# Patient Record
Sex: Female | Born: 1990 | Race: White | Hispanic: No | Marital: Single | Smoking: Former smoker
Health system: Western US, Community
[De-identification: ages and names within clinical notes are randomized; demographics above are authoritative.]

## PROBLEM LIST (undated history)

## (undated) DIAGNOSIS — W19XXXA Unspecified fall, initial encounter: Secondary | ICD-10-CM

## (undated) DIAGNOSIS — S060X9A Concussion with loss of consciousness of unspecified duration, initial encounter: Secondary | ICD-10-CM

## (undated) DIAGNOSIS — G43109 Migraine with aura, not intractable, without status migrainosus: Secondary | ICD-10-CM

## (undated) DIAGNOSIS — H9190 Unspecified hearing loss, unspecified ear: Secondary | ICD-10-CM

## (undated) HISTORY — DX: Concussion with loss of consciousness of unspecified duration, initial encounter: S06.0X9A

## (undated) HISTORY — DX: Unspecified fall, initial encounter: W19.XXXA

## (undated) HISTORY — DX: Unspecified hearing loss, unspecified ear: H91.90

## (undated) HISTORY — DX: Migraine with aura, not intractable, without status migrainosus: G43.109

---
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
## 2009-06-10 ENCOUNTER — Ambulatory Visit: Payer: Self-pay | Admitting: Occupational Medicine

## 2009-06-10 DIAGNOSIS — S0010XA Contusion of unspecified eyelid and periocular area, initial encounter: Secondary | ICD-10-CM | POA: Insufficient documentation

## 2013-05-26 ENCOUNTER — Encounter: Payer: Self-pay | Admitting: Certified Nurse Midwife

## 2013-05-27 ENCOUNTER — Encounter: Payer: Self-pay | Admitting: Gynecology

## 2013-05-27 ENCOUNTER — Other Ambulatory Visit: Payer: Self-pay | Admitting: Gynecology

## 2013-05-27 ENCOUNTER — Ambulatory Visit (INDEPENDENT_AMBULATORY_CARE_PROVIDER_SITE_OTHER): Payer: BC Managed Care – PPO | Admitting: Gynecology

## 2013-05-27 VITALS — BP 100/60 | HR 70 | Resp 12 | Ht 66.75 in | Wt 156.0 lb

## 2013-05-27 DIAGNOSIS — Z113 Encounter for screening for infections with a predominantly sexual mode of transmission: Secondary | ICD-10-CM

## 2013-05-27 DIAGNOSIS — N83209 Unspecified ovarian cyst, unspecified side: Secondary | ICD-10-CM

## 2013-05-27 DIAGNOSIS — R102 Pelvic and perineal pain: Secondary | ICD-10-CM | POA: Insufficient documentation

## 2013-05-27 DIAGNOSIS — N949 Unspecified condition associated with female genital organs and menstrual cycle: Secondary | ICD-10-CM

## 2013-05-27 LAB — RPR

## 2013-05-27 LAB — POCT URINE PREGNANCY: Preg Test, Ur: NEGATIVE

## 2013-05-27 NOTE — Patient Instructions (Signed)
Ovarian Cyst  The ovaries are small organs that are on each side of the uterus. The ovaries are the organs that produce the female hormones, estrogen and progesterone. An ovarian cyst is a sac filled with fluid that can vary in its size. It is normal for a small cyst to form in women who are in the childbearing age and who have menstrual periods. This type of cyst is called a follicle cyst that becomes an ovulation cyst (corpus luteum cyst) after it produces the women's egg. It later goes away on its own if the woman does not become pregnant. There are other kinds of ovarian cysts that may cause problems and may need to be treated. The most serious problem is a cyst with cancer. It should be noted that menopausal women who have an ovarian cyst are at a higher risk of it being a cancer cyst. They should be evaluated very quickly, thoroughly and followed closely. This is especially true in menopausal women because of the high rate of ovarian cancer in women in menopause.  CAUSES AND TYPES OF OVARIAN CYSTS:   FUNCTIONAL CYST: The follicle/corpus luteum cyst is a functional cyst that occurs every month during ovulation with the menstrual cycle. They go away with the next menstrual cycle if the woman does not get pregnant. Usually, there are no symptoms with a functional cyst.   ENDOMETRIOMA CYST: This cyst develops from the lining of the uterus tissue. This cyst gets in or on the ovary. It grows every month from the bleeding during the menstrual period. It is also called a "chocolate cyst" because it becomes filled with blood that turns brown. This cyst can cause pain in the lower abdomen during intercourse and with your menstrual period.   CYSTADENOMA CYST: This cyst develops from the cells on the outside of the ovary. They usually are not cancerous. They can get very big and cause lower abdomen pain and pain with intercourse. This type of cyst can twist on itself, cut off its blood supply and cause severe pain. It  also can easily rupture and cause a lot of pain.   DERMOID CYST: This type of cyst is sometimes found in both ovaries. They are found to have different kinds of body tissue in the cyst. The tissue includes skin, teeth, hair, and/or cartilage. They usually do not have symptoms unless they get very big. Dermoid cysts are rarely cancerous.   POLYCYSTIC OVARY: This is a rare condition with hormone problems that produces many small cysts on both ovaries. The cysts are follicle-like cysts that never produce an egg and become a corpus luteum. It can cause an increase in body weight, infertility, acne, increase in body and facial hair and lack of menstrual periods or rare menstrual periods. Many women with this problem develop type 2 diabetes. The exact cause of this problem is unknown. A polycystic ovary is rarely cancerous.   THECA LUTEIN CYST: Occurs when too much hormone (human chorionic gonadotropin) is produced and over-stimulates the ovaries to produce an egg. They are frequently seen when doctors stimulate the ovaries for invitro-fertilization (test tube babies).   LUTEOMA CYST: This cyst is seen during pregnancy. Rarely it can cause an obstruction to the birth canal during labor and delivery. They usually go away after delivery.  SYMPTOMS    Pelvic pain or pressure.   Pain during sexual intercourse.   Increasing girth (swelling) of the abdomen.   Abnormal menstrual periods.   Increasing pain with menstrual periods.     You stop having menstrual periods and you are not pregnant.  DIAGNOSIS   The diagnosis can be made during:   Routine or annual pelvic examination (common).   Ultrasound.   X-ray of the pelvis.   CT Scan.   MRI.   Blood tests.  TREATMENT    Treatment may only be to follow the cyst monthly for 2 to 3 months with your caregiver. Many go away on their own, especially functional cysts.   May be aspirated (drained) with a long needle with ultrasound, or by laparoscopy (inserting a tube into  the pelvis through a small incision).   The whole cyst can be removed by laparoscopy.   Sometimes the cyst may need to be removed through an incision in the lower abdomen.   Hormone treatment is sometimes used to help dissolve certain cysts.   Birth control pills are sometimes used to help dissolve certain cysts.  HOME CARE INSTRUCTIONS   Follow your caregiver's advice regarding:   Medicine.   Follow up visits to evaluate and treat the cyst.   You may need to come back or make an appointment with another caregiver, to find the exact cause of your cyst, if your caregiver is not a gynecologist.   Get your yearly and recommended pelvic examinations and Pap tests.   Let your caregiver know if you have had an ovarian cyst in the past.  SEEK MEDICAL CARE IF:    Your periods are late, irregular, they stop, or are painful.   Your stomach (abdomen) or pelvic pain does not go away.   Your stomach becomes larger or swollen.   You have pressure on your bladder or trouble emptying your bladder completely.   You have painful sexual intercourse.   You have feelings of fullness, pressure, or discomfort in your stomach.   You lose weight for no apparent reason.   You feel generally ill.   You become constipated.   You lose your appetite.   You develop acne.   You have an increase in body and facial hair.   You are gaining weight, without changing your exercise and eating habits.   You think you are pregnant.  SEEK IMMEDIATE MEDICAL CARE IF:    You have increasing abdominal pain.   You feel sick to your stomach (nausea) and/or vomit.   You develop a fever that comes on suddenly.   You develop abdominal pain during a bowel movement.   Your menstrual periods become heavier than usual.  Document Released: 12/09/2005 Document Revised: 03/02/2012 Document Reviewed: 10/12/2009  [employer] Patient Information 2014 [employer].

## 2013-05-27 NOTE — Progress Notes (Signed)
Subjective:     Patient ID: Yesenia Martin, female   DOB: 1991-09-01, 22 y.o.   MRN: [id number]  HPI Comments: Pt seen at [facility] in [place] for sharp pain suprapubic pain that began that evening, paain severe enough that pt could not walk upright, pt reports nausea, felt feverish, dizzy.  , As part of evaluation, pt had pelvic ultrasound which showed either a hydrosalpinx vs a paratubal cyst-images NA for this visit.  Pt had GC/CTM done and was  started on antibiotics- Ciprofloxin and Doxycycline each for 2w. , she has been sexually active, most recently 5/24, LMP 04/23/2013,  and is on birth control, denies missed pills.  Pt had negative GC/CTM here in 06/2012.  Pt's current partner since October 2013. Pt reports feeling better but exhausted.      Review of Systems  Constitutional: Positive for fatigue. Negative for fever, chills and activity change.  Gastrointestinal: Positive for nausea (once after abx) and vomiting (once after abx). Negative for abdominal pain, diarrhea and constipation.  Genitourinary: Positive for vaginal bleeding (spotting, beige not brown or red) and pelvic pain (resolving). Negative for vaginal discharge.  All other systems reviewed and are negative.       Objective:   Physical Exam  Constitutional: She is oriented to person, place, and time. She appears well-developed and well-nourished.  Abdominal: Soft. She exhibits no distension and no mass. There is no tenderness. There is no rebound and no guarding.  Neurological: She is alert and oriented to person, place, and time.  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

## 2013-05-31 LAB — HSV(HERPES SIMPLEX VRS) I + II AB-IGG: HSV 1 Glycoprotein G Ab, IgG: <0.1 IV

## 2013-06-09 ENCOUNTER — Telehealth: Payer: Self-pay | Admitting: Gynecology

## 2013-06-09 NOTE — Telephone Encounter (Signed)
Spoke with pt who finished her antibiotics 2 days ago and is feeling better. Pt inquiring about need for another [place]. Advised that since the [place] she had done did not give a definitive diagnosis, Dr. Farrel Gobble wanted to do one in our office to hopefully get a better picture of what caused the pain. Pt agreeable. Advised our insurance dept would be in touch about OOP cost, and then we would schedule it.

## 2013-06-09 NOTE — Telephone Encounter (Signed)
Patient has finished medicine and wants to speak with nurse about whether she needs an ultrasound or not.

## 2013-06-15 ENCOUNTER — Ambulatory Visit (INDEPENDENT_AMBULATORY_CARE_PROVIDER_SITE_OTHER): Payer: BC Managed Care – PPO | Admitting: Gynecology

## 2013-06-15 ENCOUNTER — Ambulatory Visit (INDEPENDENT_AMBULATORY_CARE_PROVIDER_SITE_OTHER): Payer: BC Managed Care – PPO

## 2013-06-15 DIAGNOSIS — N9489 Other specified conditions associated with female genital organs and menstrual cycle: Secondary | ICD-10-CM | POA: Insufficient documentation

## 2013-06-15 DIAGNOSIS — N83209 Unspecified ovarian cyst, unspecified side: Secondary | ICD-10-CM

## 2013-06-15 DIAGNOSIS — N839 Noninflammatory disorder of ovary, fallopian tube and broad ligament, unspecified: Secondary | ICD-10-CM

## 2013-06-15 NOTE — Progress Notes (Signed)
Pt here for f/u u/s after being seen in urgent care 6/3 for acute pelvic pain and thought to have a hydrosalpinx vs ovarian cyst on left.  Pt states that the pain that brought her to the ER has resolved and not recurred.  She denies history of STD in past. U/s images from today compared with report from urgent care:  A left adnexal mass is again , it has increased in size, now 6x4x6cm, a second thin wall cyst is noted, 2.8x2.9cm.  The is free fluid around the left adnexa and in cul de sac, it appears echofree.  The uterus is normal, there is a possible hydrosalpinx on the right. The differential was discussed, either a cystadenoma on left or bilateral hydorsalpinx, left greater than right.  We discussed operative evaluation and treatment of the pelvis, either removal of the cyst on the left or repair of the tube, both were reviewed with the patient, as she is not having pain, I referred her to acog, webmd and medscape websites to read about cystadenomas and hydrosalpinx. She agrees, she will get back to [place]  66m discussing adnexal mass and treatment

## 2013-06-15 NOTE — Patient Instructions (Signed)
Ovarian Cyst The ovaries are small organs that are on each side of the uterus. The ovaries are the organs that produce the female hormones, estrogen and progesterone. An ovarian cyst is a sac filled with fluid that can vary in its size. It is normal for a small cyst to form in women who are in the childbearing age and who have menstrual periods. This type of cyst is called a follicle cyst that becomes an ovulation cyst (corpus luteum cyst) after it produces the women's egg. It later goes away on its own if the woman does not become pregnant. There are other kinds of ovarian cysts that may cause problems and may need to be treated. The most serious problem is a cyst with cancer. It should be noted that menopausal women who have an ovarian cyst are at a higher risk of it being a cancer cyst. They should be evaluated very quickly, thoroughly and followed closely. This is especially true in menopausal women because of the high rate of ovarian cancer in women in menopause. CAUSES AND TYPES OF OVARIAN CYSTS:  FUNCTIONAL CYST: The follicle/corpus luteum cyst is a functional cyst that occurs every month during ovulation with the menstrual cycle. They go away with the next menstrual cycle if the woman does not get pregnant. Usually, there are no symptoms with a functional cyst.  ENDOMETRIOMA CYST: This cyst develops from the lining of the uterus tissue. This cyst gets in or on the ovary. It grows every month from the bleeding during the menstrual period. It is also called a "chocolate cyst" because it becomes filled with blood that turns brown. This cyst can cause pain in the lower abdomen during intercourse and with your menstrual period.  CYSTADENOMA CYST: This cyst develops from the cells on the outside of the ovary. They usually are not cancerous. They can get very big and cause lower abdomen pain and pain with intercourse. This type of cyst can twist on itself, cut off its blood supply and cause severe pain. It  also can easily rupture and cause a lot of pain.  DERMOID CYST: This type of cyst is sometimes found in both ovaries. They are found to have different kinds of body tissue in the cyst. The tissue includes skin, teeth, hair, and/or cartilage. They usually do not have symptoms unless they get very big. Dermoid cysts are rarely cancerous.  POLYCYSTIC OVARY: This is a rare condition with hormone problems that produces many small cysts on both ovaries. The cysts are follicle-like cysts that never produce an egg and become a corpus luteum. It can cause an increase in body weight, infertility, acne, increase in body and facial hair and lack of menstrual periods or rare menstrual periods. Many women with this problem develop type 2 diabetes. The exact cause of this problem is unknown. A polycystic ovary is rarely cancerous.  THECA LUTEIN CYST: Occurs when too much hormone (human chorionic gonadotropin) is produced and over-stimulates the ovaries to produce an egg. They are frequently seen when doctors stimulate the ovaries for invitro-fertilization (test tube babies).  LUTEOMA CYST: This cyst is seen during pregnancy. Rarely it can cause an obstruction to the birth canal during labor and delivery. They usually go away after delivery. SYMPTOMS   Pelvic pain or pressure.  Pain during sexual intercourse.  Increasing girth (swelling) of the abdomen.  Abnormal menstrual periods.  Increasing pain with menstrual periods.  You stop having menstrual periods and you are not pregnant. DIAGNOSIS  The diagnosis can   be made during:  Routine or annual pelvic examination (common).  Ultrasound.  X-ray of the pelvis.  CT Scan.  MRI.  Blood tests. TREATMENT   Treatment may only be to follow the cyst monthly for 2 to 3 months with your caregiver. Many go away on their own, especially functional cysts.  May be aspirated (drained) with a long needle with ultrasound, or by laparoscopy (inserting a tube into  the pelvis through a small incision).  The whole cyst can be removed by laparoscopy.  Sometimes the cyst may need to be removed through an incision in the lower abdomen.  Hormone treatment is sometimes used to help dissolve certain cysts.  Birth control pills are sometimes used to help dissolve certain cysts. HOME CARE INSTRUCTIONS  Follow your caregiver's advice regarding:  Medicine.  Follow up visits to evaluate and treat the cyst.  You may need to come back or make an appointment with another caregiver, to find the exact cause of your cyst, if your caregiver is not a gynecologist.  Get your yearly and recommended pelvic examinations and Pap tests.  Let your caregiver know if you have had an ovarian cyst in the past. SEEK MEDICAL CARE IF:   Your periods are late, irregular, they stop, or are painful.  Your stomach (abdomen) or pelvic pain does not go away.  Your stomach becomes larger or swollen.  You have pressure on your bladder or trouble emptying your bladder completely.  You have painful sexual intercourse.  You have feelings of fullness, pressure, or discomfort in your stomach.  You lose weight for no apparent reason.  You feel generally ill.  You become constipated.  You lose your appetite.  You develop acne.  You have an increase in body and facial hair.  You are gaining weight, without changing your exercise and eating habits.  You think you are pregnant. SEEK IMMEDIATE MEDICAL CARE IF:   You have increasing abdominal pain.  You feel sick to your stomach (nausea) and/or vomit.  You develop a fever that comes on suddenly.  You develop abdominal pain during a bowel movement.  Your menstrual periods become heavier than usual. Document Released: 12/09/2005 Document Revised: 03/02/2012 Document Reviewed: 10/12/2009 [facility] Patient Information 2014 [place], [place]. [email or web address], [email or web address], [email or web address] Please call if develop pelvic pain acutely again,  consider options

## 2013-07-05 ENCOUNTER — Encounter: Payer: Self-pay | Admitting: Certified Nurse Midwife

## 2013-07-22 ENCOUNTER — Ambulatory Visit: Payer: Self-pay | Admitting: Certified Nurse Midwife

## 2013-07-23 ENCOUNTER — Encounter: Payer: Self-pay | Admitting: Certified Nurse Midwife

## 2013-07-23 ENCOUNTER — Ambulatory Visit (INDEPENDENT_AMBULATORY_CARE_PROVIDER_SITE_OTHER): Payer: BC Managed Care – PPO | Admitting: Certified Nurse Midwife

## 2013-07-23 VITALS — BP 104/64 | HR 68 | Resp 16 | Ht 66.75 in | Wt 159.0 lb

## 2013-07-23 DIAGNOSIS — IMO0001 Reserved for inherently not codable concepts without codable children: Secondary | ICD-10-CM

## 2013-07-23 DIAGNOSIS — Z309 Encounter for contraceptive management, unspecified: Secondary | ICD-10-CM

## 2013-07-23 DIAGNOSIS — Z01419 Encounter for gynecological examination (general) (routine) without abnormal findings: Secondary | ICD-10-CM

## 2013-07-23 DIAGNOSIS — Z0000 Encounter for general adult medical examination without abnormal findings: Secondary | ICD-10-CM

## 2013-07-23 LAB — HEMOGLOBIN, FINGERSTICK: Hemoglobin, fingerstick: 13.2 g/dL (ref 12.0–16.0)

## 2013-07-23 MED ORDER — NITROFURANTOIN MONOHYD MACRO 100 MG PO CAPS: 100.0000 mg | ORAL_CAPSULE | ORAL | Status: DC

## 2013-07-23 MED ORDER — NORETHIN ACE-ETH ESTRAD-FE 1-20 MG-MCG PO TABS: 1.0000 | ORAL_TABLET | Freq: Every morning | ORAL | Status: DC

## 2013-07-23 NOTE — Patient Instructions (Signed)
General topics  Next pap or exam is  due in 1 year Take a Women's multivitamin Take 1200 mg. of calcium daily - prefer dietary If any concerns in interim to call back  Breast Self-Awareness Practicing breast self-awareness may pick up problems early, prevent significant medical complications, and possibly save your life. By practicing breast self-awareness, you can become familiar with how your breasts look and feel and if your breasts are changing. This allows you to notice changes early. It can also offer you some reassurance that your breast health is good. One way to learn what is normal for your breasts and whether your breasts are changing is to do a breast self-exam. If you find a lump or something that was not present in the past, it is best to contact your caregiver right away. Other findings that should be evaluated by your caregiver include nipple discharge, especially if it is bloody; skin changes or reddening; areas where the skin seems to be pulled in (retracted); or new lumps and bumps. Breast pain is seldom associated with cancer (malignancy), but should also be evaluated by a caregiver. BREAST SELF-EXAM The best time to examine your breasts is 5 7 days after your menstrual period is over.  ExitCare Patient Information 2013 [employer].   Exercise to Stay Healthy Exercise helps you become and stay healthy. EXERCISE IDEAS AND TIPS Choose exercises that:  You enjoy.  Fit into your day. You do not need to exercise really hard to be healthy. You can do exercises at a slow or medium level and stay healthy. You can:  Stretch before and after working out.  Try yoga, Pilates, or tai chi.  Lift weights.  Walk fast, swim, jog, run, climb stairs, bicycle, dance, or rollerskate.  Take aerobic classes. Exercises that burn about 150 calories:  Running 1  miles in 15 minutes.  Playing volleyball for 45 to 60 minutes.  Washing and waxing a car for 45 to 60  minutes.  Playing touch football for 45 minutes.  Walking 1  miles in 35 minutes.  Pushing a stroller 1  miles in 30 minutes.  Playing basketball for 30 minutes.  Raking leaves for 30 minutes.  Bicycling 5 miles in 30 minutes.  Walking 2 miles in 30 minutes.  Dancing for 30 minutes.  Shoveling snow for 15 minutes.  Swimming laps for 20 minutes.  Walking up stairs for 15 minutes.  Bicycling 4 miles in 15 minutes.  Gardening for 30 to 45 minutes.  Jumping rope for 15 minutes.  Washing windows or floors for 45 to 60 minutes. Document Released: 01/11/2011 Document Revised: 03/02/2012 Document Reviewed: 01/11/2011 ExitCare Patient Information 2013 [employer].   Other topics ( that may be useful information):    Sexually Transmitted Disease Sexually transmitted disease (STD) refers to any infection that is passed from person to person during sexual activity. This may happen by way of saliva, semen, blood, vaginal mucus, or urine. Common STDs include:  Gonorrhea.  Chlamydia.  Syphilis.  HIV/AIDS.  Genital herpes.  Hepatitis B and C.  Trichomonas.  Human papillomavirus (HPV).  Pubic lice. CAUSES  An STD may be spread by bacteria, virus, or parasite. A person can get an STD by:  Sexual intercourse with an infected person.  Sharing sex toys with an infected person.  Sharing needles with an infected person.  Having intimate contact with the genitals, mouth, or rectal areas of an infected person. SYMPTOMS  Some people may not have any symptoms, but   they can still pass the infection to others. Different STDs have different symptoms. Symptoms include:  Painful or bloody urination.  Pain in the pelvis, abdomen, vagina, anus, throat, or eyes.  Skin rash, itching, irritation, growths, or sores (lesions). These usually occur in the genital or anal area.  Abnormal vaginal discharge.  Penile discharge in men.  Soft, flesh-colored skin growths in the  genital or anal area.  Fever.  Pain or bleeding during sexual intercourse.  Swollen glands in the groin area.  Yellow skin and eyes (jaundice). This is seen with hepatitis. DIAGNOSIS  To make a diagnosis, your caregiver may:  Take a medical history.  Perform a physical exam.  Take a specimen (culture) to be examined.  Examine a sample of discharge under a microscope.  Perform blood test TREATMENT   Chlamydia, gonorrhea, trichomonas, and syphilis can be cured with antibiotic medicine.  Genital herpes, hepatitis, and HIV can be treated, but not cured, with prescribed medicines. The medicines will lessen the symptoms.  Genital warts from HPV can be treated with medicine or by freezing, burning (electrocautery), or surgery. Warts may come back.  HPV is a virus and cannot be cured with medicine or surgery.However, abnormal areas may be followed very closely by your caregiver and may be removed from the cervix, vagina, or vulva through office procedures or surgery. If your diagnosis is confirmed, your recent sexual partners need treatment. This is true even if they are symptom-free or have a negative culture or evaluation. They should not have sex until their caregiver says it is okay. HOME CARE INSTRUCTIONS  All sexual partners should be informed, tested, and treated for all STDs.  Take your antibiotics as directed. Finish them even if you start to feel better.  Only take over-the-counter or prescription medicines for pain, discomfort, or fever as directed by your caregiver.  Rest.  Eat a balanced diet and drink enough fluids to keep your urine clear or pale yellow.  Do not have sex until treatment is completed and you have followed up with your caregiver. STDs should be checked after treatment.  Keep all follow-up appointments, Pap tests, and blood tests as directed by your caregiver.  Only use latex condoms and water-soluble lubricants during sexual activity. Do not use  petroleum jelly or oils.  Avoid alcohol and illegal drugs.  Get vaccinated for HPV and hepatitis. If you have not received these vaccines in the past, talk to your caregiver about whether one or both might be right for you.  Avoid risky sex practices that can break the skin. The only way to avoid getting an STD is to avoid all sexual activity.Latex condoms and dental dams (for oral sex) will help lessen the risk of getting an STD, but will not completely eliminate the risk. SEEK MEDICAL CARE IF:   You have a fever.  You have any new or worsening symptoms. Document Released: 03/01/2003 Document Revised: 03/02/2012 Document Reviewed: 03/08/2011 ExitCare Patient Information 2013 [employer]Care[employer].    Domestic Abuse You are being battered or abused if someone close to you hits, pushes, or physically hurts you in any way. You also are being abused if you are forced into activities. You are being sexually abused if you are forced to have sexual contact of any kind. You are being emotionally abused if you are made to feel worthless or if you are constantly threatened. It is important to remember that help is available. No one has the right to abuse you. PREVENTION OF FURTHER   ABUSE  Learn the warning signs of danger. This varies with situations but may include: the use of alcohol, threats, isolation from friends and family, or forced sexual contact. Leave if you feel that violence is going to occur.  If you are attacked or beaten, report it to the police so the abuse is documented. You do not have to press charges. The police can protect you while you or the attackers are leaving. Get the officer's name and badge number and a copy of the report.  Find someone you can trust and tell them what is happening to you: your caregiver, a nurse, clergy member, close friend or family member. Feeling ashamed is natural, but remember that you have done nothing wrong. No one deserves abuse. Document Released:  12/06/2000 Document Revised: 03/02/2012 Document Reviewed: 02/14/2011 ExitCare Patient Information 2013 [employer].    How Much is Too Much Alcohol? Drinking too much alcohol can cause injury, accidents, and health problems. These types of problems can include:   Car crashes.  Falls.  Family fighting (domestic violence).  Drowning.  Fights.  Injuries.  Burns.  Damage to certain organs.  Having a baby with birth defects. ONE DRINK CAN BE TOO MUCH WHEN YOU ARE:  Working.  Pregnant or breastfeeding.  Taking medicines. Ask your doctor.  Driving or planning to drive. If you or someone you know has a drinking problem, get help from a doctor.  Document Released: 10/05/2009 Document Revised: 03/02/2012 Document Reviewed: 10/05/2009 ExitCare Patient Information 2013 [employer].   Smoking Hazards Smoking cigarettes is extremely bad for your health. Tobacco smoke has over 200 known poisons in it. There are over 60 chemicals in tobacco smoke that cause cancer. Some of the chemicals found in cigarette smoke include:   Cyanide.  Benzene.  Formaldehyde.  Methanol (wood alcohol).  Acetylene (fuel used in welding torches).  Ammonia. Cigarette smoke also contains the poisonous gases nitrogen oxide and carbon monoxide.  Cigarette smokers have an increased risk of many serious medical problems and Smoking causes approximately:  90% of all lung cancer deaths in men.  80% of all lung cancer deaths in women.  90% of deaths from chronic obstructive lung disease. Compared with nonsmokers, smoking increases the risk of:  Coronary heart disease by 2 to 4 times.  Stroke by 2 to 4 times.  Men developing lung cancer by 23 times.  Women developing lung cancer by 13 times.  Dying from chronic obstructive lung diseases by 12 times.  . Smoking is the most preventable cause of death and disease in our society.  WHY IS SMOKING ADDICTIVE?  Nicotine is the chemical  agent in tobacco that is capable of causing addiction or dependence.  When you smoke and inhale, nicotine is absorbed rapidly into the bloodstream through your lungs. Nicotine absorbed through the lungs is capable of creating a powerful addiction. Both inhaled and non-inhaled nicotine may be addictive.  Addiction studies of cigarettes and spit tobacco show that addiction to nicotine occurs mainly during the teen years, when young people begin using tobacco products. WHAT ARE THE BENEFITS OF QUITTING?  There are many health benefits to quitting smoking.   Likelihood of developing cancer and heart disease decreases. Health improvements are seen almost immediately.  Blood pressure, pulse rate, and breathing patterns start returning to normal soon after quitting. QUITTING SMOKING   American Lung Association - 1-800-LUNGUSA  American Cancer Society - 1-800-ACS-2345 Document Released: 01/16/2005 Document Revised: 03/02/2012 Document Reviewed: 09/20/2009 ExitCare Patient Information 2013 ExitCare,   LLC.   Stress Management Stress is a state of physical or mental tension that often results from changes in your life or normal routine. Some common causes of stress are:  Death of a loved one.  Injuries or severe illnesses.  Getting fired or changing jobs.  Moving into a new home. Other causes may be:  Sexual problems.  Business or financial losses.  Taking on a large debt.  Regular conflict with someone at home or at work.  Constant tiredness from lack of sleep. It is not just bad things that are stressful. It may be stressful to:  Win the lottery.  Get married.  Buy a new car. The amount of stress that can be easily tolerated varies from person to person. Changes generally cause stress, regardless of the types of change. Too much stress can affect your health. It may lead to physical or emotional problems. Too little stress (boredom) may also become stressful. SUGGESTIONS TO  REDUCE STRESS:  Talk things over with your family and friends. It often is helpful to share your concerns and worries. If you feel your problem is serious, you may want to get help from a professional counselor.  Consider your problems one at a time instead of lumping them all together. Trying to take care of everything at once may seem impossible. List all the things you need to do and then start with the most important one. Set a goal to accomplish 2 or 3 things each day. If you expect to do too many in a single day you will naturally fail, causing you to feel even more stressed.  Do not use alcohol or drugs to relieve stress. Although you may feel better for a short time, they do not remove the problems that caused the stress. They can also be habit forming.  Exercise regularly - at least 3 times per week. Physical exercise can help to relieve that "uptight" feeling and will relax you.  The shortest distance between despair and hope is often a good night's sleep.  Go to bed and get up on time allowing yourself time for appointments without being rushed.  Take a short "time-out" period from any stressful situation that occurs during the day. Close your eyes and take some deep breaths. Starting with the muscles in your face, tense them, hold it for a few seconds, then relax. Repeat this with the muscles in your neck, shoulders, hand, stomach, back and legs.  Take good care of yourself. Eat a balanced diet and get plenty of rest.  Schedule time for having fun. Take a break from your daily routine to relax. HOME CARE INSTRUCTIONS   Call if you feel overwhelmed by your problems and feel you can no longer manage them on your own.  Return immediately if you feel like hurting yourself or someone else. Document Released: 06/04/2001 Document Revised: 03/02/2012 Document Reviewed: 01/25/2008 ExitCare Patient Information 2013 ExitCare, LLC.   

## 2013-07-23 NOTE — Progress Notes (Signed)
22 y.o. G0P0000 Single Caucasian Fe here for annual exam. Periods normal now. Contraception working well. Sexually active, no partner change. No STD screening needed. Patient has had follow up with her mother's Gyn for left ovarian cyst. Per patient PUS 2 weeks ago showed it had decreased in size and the MD felt it was a functional cyst. Patient has another PUS scheduled for one month. Will request records. Patient treated here for post coital UTI's, Macrobid working well for prevention. Patient will be moving to [place] with partner in fall. Unsure of care plans yet, will advise. No health issues today.  Parents "finally divorced, a good thing"  Patient emotionally OK.  Patient's last menstrual period was 06/23/2013.          Sexually active: yes  The current method of family planning is OCP (estrogen/progesterone).    Exercising: yes  The patient has a physically strenuous job, but has no regular exercise apart from work.  Smoker:  yes  Health Maintenance: Pap: Lgsil 07-21-12 MMG:  none Colonoscopy:  none BMD:   none TDaP:  2009 Labs: Hgb-13.2 Self breast exam: done occ   reports that she has been smoking.  She does not have any smokeless tobacco history on file. She reports that she drinks about 1.5 ounces of alcohol per week. She reports that she does not use illicit drugs.  Past Medical History  Diagnosis Date  . 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 Fall     with sutures- chin  . Migraine with aura     History reviewed. No pertinent past surgical history.  Current Outpatient Prescriptions  Medication Sig Dispense Refill  . JUNEL FE 1/20 1-20 MG-MCG tablet 1 tablet every morning.      . methylphenidate (CONCERTA) 54 MG CR tablet Take 54 mg by mouth as needed.       No current facility-administered medications for this visit.    History reviewed. No pertinent family history.  ROS:  Pertinent items are noted in HPI.  Otherwise, a comprehensive ROS was  negative.  Exam:   BP 104/64  Pulse 68  Resp 16  Ht 5' 6.75" (1.695 m)  Wt 159 lb (72.122 kg)  BMI 25.1 kg/m2  LMP 06/23/2013 Height: 5' 6.75" (169.5 cm)  Ht Readings from Last 3 Encounters:  07/23/13 5' 6.75" (1.695 m)  05/27/13 5' 6.75" (1.695 m)    General appearance: alert, cooperative and appears stated age Head: Normocephalic, without obvious abnormality, atraumatic Neck: no adenopathy, supple, symmetrical, trachea midline and thyroid normal to inspection and palpation Lungs: clear to auscultation bilaterally Breasts: normal appearance, no masses or tenderness, No nipple retraction or dimpling, No nipple discharge or bleeding, No axillary or supraclavicular adenopathy Heart: regular rate and rhythm Abdomen: soft, non-tender; no masses,  no organomegaly Extremities: extremities normal, atraumatic, no cyanosis or edema Skin: Skin color, texture, turgor normal. No rashes or lesions Lymph nodes: Cervical, supraclavicular, and axillary nodes normal. No abnormal inguinal nodes palpated Neurologic: Grossly normal   Pelvic: External genitalia:  no lesions              Urethra:  normal appearing urethra with no masses, tenderness or lesions              Bartholin's and Skene's: normal                 Vagina: normal appearing vagina with normal color and discharge, no lesions  Cervix: normal, non tender, pap taken              Pap taken: yes Bimanual Exam:  Uterus:  normal size, contour, position, consistency, mobility, non-tender and anteverted              Adnexa: normal adnexa, no mass, fullness, tenderness and left ovary does not feel enlarged               Rectovaginal: Confirms               Anus:  normal sphincter tone, no lesions  A:  Well Woman with normal exam  Contraception OCP  History of Post-Coital UTI Macrobid working well  History of LSIL repeat pap today  History of left ovarian cyst reducing in size with another GYN management, records  requested  P:   Reviewed health and wellness pertinent to exam  Rx Junel Fe 1/20 see order  Rx Macrobid see order  Continue follow up as planned, will work with patient here after records obtained if patient prefers  Pap smear as per guidelines   pap smear taken today, if negative or same will repeat Pap again in one year, otherwise per finding recommendation.  Patient aware of importance of follow up.  counseled on breast self exam, STD prevention, use and side effects of OCP's, adequate intake of calcium and vitamin D, diet and exercise  return annually or prn  An After Visit Summary was printed and given to the patient.

## 2013-07-26 NOTE — Progress Notes (Signed)
Note reviewed, agree with plan.  Jood Retana, MD  

## 2013-09-30 ENCOUNTER — Telehealth: Payer: Self-pay | Admitting: Nurse Practitioner

## 2013-09-30 NOTE — Telephone Encounter (Signed)
Spoke with pt who was supposed to start her cycle last week on 09-22-13. Pt on Junel, no skipped or late pills. No new meds. No recent illness requiring antibiotics. Pt has taken OTC UPT which was negative. Any further advice?

## 2013-09-30 NOTE — Telephone Encounter (Signed)
Patient says she has not started her cycle and is concerned. Patient says she took a OTC pregnancy test with negative results.

## 2013-10-04 NOTE — Telephone Encounter (Signed)
Pt had Aex 07-23-13 with Debbie (in Epic). Should I just have her watch and keep menstrual calendar, and call if she continues to have no period?

## 2013-10-04 NOTE — Telephone Encounter (Signed)
Most likely amenorrhea is just secondary to OCP and thinning of the endo lining.  But I also see that she has not had her AEX with Ms. Debbie since 07/21/12.  Needs AEX.she has only been seen for UTI since then.

## 2013-10-06 NOTE — Telephone Encounter (Signed)
Make sure patient is taking pills consistently, and if negative UPT just related to pill use. Needs to also use condoms for STD protection. Have her keep menses calendar and advise with next expected period of outcome

## 2013-10-07 NOTE — Telephone Encounter (Signed)
Spoke with patient. Message from Verner Chol CNM given. Will follow up prn. Patient verbalizes understanding of message.

## 2013-10-28 ENCOUNTER — Other Ambulatory Visit: Payer: Self-pay

## 2013-10-28 ENCOUNTER — Telehealth: Payer: Self-pay | Admitting: Certified Nurse Midwife

## 2013-10-28 DIAGNOSIS — IMO0001 Reserved for inherently not codable concepts without codable children: Secondary | ICD-10-CM

## 2013-10-28 NOTE — Telephone Encounter (Signed)
Pt. has some questions regarding a generic or brand name. She needs to transfer her pres to a [facility] in [place] where she now lives.

## 2013-10-29 MED ORDER — NORETHIN ACE-ETH ESTRAD-FE 1-20 MG-MCG PO TABS: 1.0000 | ORAL_TABLET | Freq: Every morning | ORAL | Status: DC

## 2013-10-29 NOTE — Telephone Encounter (Signed)
S/w patient she is needing to have her [facility] transferred to her [facility] (confirmed with patient) in [place]. She was here for AEX 07/23/13 rx was sent for #1/12 rf's. Patient says that they [facility]'t have a [facility] where she lives, would like it sent to [facility]. Patient goes on to say she's 3 days out of taking her [facility] and wanted to know what she needed to do. S/w Ms.Debbie and she said that she should go ahead and start new pack and use condoms as a back up method. Patient is aware  Junel 1/20 #1 pack with 8 refills sent to [facility] in [place], [place] patient is aware.

## 2013-12-19 ENCOUNTER — Emergency Department (HOSPITAL_BASED_OUTPATIENT_CLINIC_OR_DEPARTMENT_OTHER)
Admission: EM | Admit: 2013-12-19 | Discharge: 2013-12-19 | Disposition: A | Discharge status: Home / Self Care | Payer: BC Managed Care – PPO | Attending: Emergency Medicine | Admitting: Emergency Medicine

## 2013-12-19 ENCOUNTER — Encounter (HOSPITAL_BASED_OUTPATIENT_CLINIC_OR_DEPARTMENT_OTHER): Payer: Self-pay | Admitting: Emergency Medicine

## 2013-12-19 DIAGNOSIS — Z87828 Personal history of other (healed) physical injury and trauma: Secondary | ICD-10-CM | POA: Insufficient documentation

## 2013-12-19 DIAGNOSIS — F17200 Nicotine dependence, unspecified, uncomplicated: Secondary | ICD-10-CM | POA: Insufficient documentation

## 2013-12-19 DIAGNOSIS — J029 Acute pharyngitis, unspecified: Secondary | ICD-10-CM | POA: Insufficient documentation

## 2013-12-19 DIAGNOSIS — H9190 Unspecified hearing loss, unspecified ear: Secondary | ICD-10-CM | POA: Insufficient documentation

## 2013-12-19 DIAGNOSIS — J069 Acute upper respiratory infection, unspecified: Secondary | ICD-10-CM | POA: Insufficient documentation

## 2013-12-19 DIAGNOSIS — Z8679 Personal history of other diseases of the circulatory system: Secondary | ICD-10-CM | POA: Insufficient documentation

## 2013-12-19 LAB — RAPID STREP SCREEN (MED CTR MEBANE ONLY): Streptococcus, Group A Screen (Direct): NEGATIVE

## 2013-12-19 NOTE — ED Provider Notes (Signed)
CSN: [id number]     Arrival date & time 12/19/13  1015 History   First MD Initiated Contact with Patient 12/19/13 1108     Chief Complaint  Patient presents with  . Nasal Congestion  . Cough   (Consider location/radiation/quality/duration/timing/severity/associated sxs/prior Treatment) HPI 22 year old female who presents today complaining of nasal congestion, cough, and sore throat or several days. She has had some subjective fever but no chills. She has been taking by mouth without difficulty and denies nausea, vomiting, or diarrhea. She states she smokes approximately one cigarette per week. She denies any dyspnea. She states she takes birth control pills and has not missed any and has no reason to think that she is pregnant.  Past Medical History  Diagnosis Date  . 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 Fall     with sutures- chin  . Migraine with aura   . Concussion     a few of them   History reviewed. No pertinent past surgical history. Family History  Problem Relation Age of Onset  . Hypertension Father   . Cancer Father     prostate  . Hypertension Paternal Grandfather   . Hyperlipidemia Paternal Grandfather   . Cancer Other     great aunt on paternal side, ovarian cancer   History  Substance Use Topics  . Smoking status: Light Tobacco Smoker  . Smokeless tobacco: Not on file  . Alcohol Use: 1.5 - 2 oz/week    3-4 drink(s) per week   OB History   Grav Para Term Preterm Abortions TAB SAB Ect Mult Living   0 0 0 0 0 0 0 0 0 0      Review of Systems  All other systems reviewed and are negative.    Allergies  Sulfonamide derivatives  Home Medications   Current Outpatient Rx  Name  Route  Sig  Dispense  Refill  . norethindrone-ethinyl estradiol (JUNEL FE 1/20) 1-20 MG-MCG tablet   Oral   Take 1 tablet by mouth every morning.   1 Package   8    BP 116/69  Pulse 76  Temp(Src) 98.4 F (36.9 C) (Oral)  Resp 18  SpO2 100% Physical Exam   Nursing note and vitals reviewed. Constitutional: She is oriented to person, place, and time. She appears well-developed and well-nourished.  HENT:  Head: Normocephalic and atraumatic.  Right Ear: External ear normal.  Left Ear: External ear normal.  Nose: Nose normal.  Mouth/Throat: Posterior oropharyngeal erythema present.  Eyes: Conjunctivae and EOM are normal. Pupils are equal, round, and reactive to light.  Neck: Normal range of motion. Neck supple.  Cardiovascular: Normal rate, regular rhythm and normal heart sounds.   Pulmonary/Chest: Effort normal and breath sounds normal.  Abdominal: Soft. Bowel sounds are normal.  Musculoskeletal: Normal range of motion.  Lymphadenopathy:    She has cervical adenopathy.  Neurological: She is alert and oriented to person, place, and time.  Skin: Skin is warm and dry.  Psychiatric: She has a normal mood and affect. Her behavior is normal. Judgment and thought content normal.    ED Course  Procedures (including critical care time) Labs Review Labs Reviewed  RAPID STREP SCREEN  CULTURE, GROUP A STREP   Imaging Review No results found.  EKG Interpretation   None       MDM  22 year old female with upper respiratory infection symptoms and sore throat who presents today with normal vital signs and negative strep test.  I have discussed the treatment options and have advised systematic treatment only. She voices understanding. She'll be given a work note for the next 48 hours.   Hilario Quarry, MD 12/19/13 1341

## 2013-12-19 NOTE — ED Notes (Signed)
Patient here with 6 days of cough, congestion and sore throat. Taking ibuprofen. And otc cold meds with minimal relief

## 2013-12-20 ENCOUNTER — Telehealth: Payer: Self-pay | Admitting: Certified Nurse Midwife

## 2013-12-20 DIAGNOSIS — IMO0001 Reserved for inherently not codable concepts without codable children: Secondary | ICD-10-CM

## 2013-12-20 NOTE — Telephone Encounter (Signed)
Pt would like refill for the Junel Fe 120 sent to [facility] at [phone].

## 2013-12-21 LAB — CULTURE, GROUP A STREP

## 2013-12-21 MED ORDER — NORETHIN ACE-ETH ESTRAD-FE 1-20 MG-MCG PO TABS: 1.0000 | ORAL_TABLET | Freq: Every morning | ORAL | Status: DC

## 2013-12-21 NOTE — Telephone Encounter (Signed)
Left Message To Call Back  

## 2013-12-21 NOTE — Telephone Encounter (Signed)
Patient is back in [place] for the holidays is needing one refill to last her while she is here, is going back to [place] and will continue to get her rx from her [facility]. 11/05/13 #1 pack with 8 refills was sent through.  Junel 1/20 #1 pack/0refills sent to [facility], patient is aware.

## 2014-07-26 ENCOUNTER — Ambulatory Visit: Payer: BC Managed Care – PPO | Admitting: Certified Nurse Midwife

## 2014-08-28 ENCOUNTER — Other Ambulatory Visit: Payer: Self-pay | Admitting: Certified Nurse Midwife

## 2014-08-30 ENCOUNTER — Other Ambulatory Visit: Payer: Self-pay | Admitting: Certified Nurse Midwife

## 2014-08-30 NOTE — Telephone Encounter (Signed)
Will sign for one

## 2014-08-30 NOTE — Telephone Encounter (Signed)
norethindrone-ethinyl estradiol (JUNEL FE 1/20) 1-20 MG-MCG tablet  Take 1 tablet by mouth every morning., Starting 12/21/2013, Until Discontinued, Normal, Last Dose: Not Recorded  Patient calling to request request refills. She has moved out of the area to [employer] has not chosen another provider yet and requests three refills to last until she does. The patient is currently out of her medication.

## 2014-08-30 NOTE — Telephone Encounter (Signed)
Last AEX: 07/23/13 Last refill:12/21/13 #1 pack X 0 Current AEX:NS  Please advise

## 2014-08-30 NOTE — Telephone Encounter (Signed)
Last refilled: 12/21/13 #1 pack with no refills Last AEX: 07/23/13   Patient has moved to [place] please see below message (requesting 3 month supply until she finds provider)  Please Advise.

## 2014-08-31 NOTE — Telephone Encounter (Signed)
Patient aware that rx has been sent for one month only until she finds a new provider.

## 2016-05-31 NOTE — Telephone Encounter (Signed)
CAN THIS PATIENT SELF REFER?:Yes    WHAT BODY PART: ankle - right     HOW LONG HAS BODY PART BEEN INJURED?: 2 weeks weeks    HAVE YOU SEEN ANYONE FOR THIS SPECIFIC INJURY?: Yes    IF YES, WHO DID YOU SEE?: Urgent Care in [place]    HAVE YOU HAD AN XRAY, MRI OR STUDY COMPLETED?: Yes    IF YES, AT WHAT FACILITY WERE YOU SEEN?: Urgent Care in [place]     CONTACT PHONE NUMBER?: [phone]    Patient has copies of XRay and records from Urgent Care in [place] and will bring with her to her appt.

## 2016-06-06 NOTE — Telephone Encounter (Signed)
Transcribed and ready to schedule.

## 2016-06-06 NOTE — Telephone Encounter (Signed)
Notes were received. Chart notes and images are on Rich Ombrembowski's desk for review.

## 2016-06-10 ENCOUNTER — Ambulatory Visit: Admit: 2016-06-10 | Payer: BC Managed Care – PPO | Attending: Surgical

## 2016-06-10 DIAGNOSIS — S93401A Sprain of unspecified ligament of right ankle, initial encounter: Secondary | ICD-10-CM
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
## 2016-07-08 ENCOUNTER — Encounter: Payer: BC Managed Care – PPO | Attending: Surgical

## 2017-08-18 ENCOUNTER — Ambulatory Visit: Admit: 2017-08-18 | Discharge: 2017-08-18 | Payer: MEDICAID | Attending: Addiction Medicine

## 2017-08-18 DIAGNOSIS — S01139A Puncture wound without foreign body of unspecified eyelid and periocular area, initial encounter: Secondary | ICD-10-CM

## 2017-08-18 MED ORDER — ofloxacin (OCUFLOX) 0.3 % ophthalmic solution
0.3 | OPHTHALMIC | 0 refills | Status: AC
Start: 2017-08-18 — End: ?

## 2017-08-18 NOTE — Progress Notes (Signed)
Cynthia Goodman is a 26 y.o. female, DOB Apr 18, 1991, who presents today for Eye Pain (Left, possible cut from blackberry bushes, x 10 days, no vision changes)  .    HISTORY OF PRESENT ILLNESS:  HPI  This is a 26 year old woman that states that approximately 10 days ago she got punctured in her left eye from a blackberry spine from one of the bushes and states that she had immediate pain at that time and then after day or so the pain subsided completely but now over the last 3 days she feels some discomfort in that left eye. Her vision is normal and not affected.      Past Medical History:   Diagnosis Date   . Right foot injury 1997    placed into a cast - no surgery     Allergies   Allergen Reactions   . Sulfa (Sulfonamide Antibiotics) Other (See Comments)     irritation     No past surgical history on file.  Social History     Social History Main Topics   . Smoking status: Former Smoker     Years: 1.00     Quit date: 2013   . Smokeless tobacco: Never Used   . Alcohol use 3.0 - 3.6 oz/week     4 - 5 Cans of beer, 1 Glasses of wine per week   . Drug use: Yes     Frequency: 1.0 time per week     Types: Marijuana      Comment: Medical Cannibis   . Sexual activity: Yes     Partners: Male     Birth control/ protection: IUD      Comment: Copper IUD     Social History   . Marital status: Single     Spouse name: N/A   . Number of children: N/A   . Years of education: N/A     Other Topics Concern   . Caffeine Concern Yes   . Exercise Yes       REVIEW OF SYSTEMS:    Review of Systems   Constitutional: Positive for activity change. Negative for fatigue and fever.   HENT: Negative.    Eyes: Positive for pain and redness. Negative for photophobia, discharge, itching and visual disturbance.   Respiratory: Negative.    Cardiovascular: Negative.    Musculoskeletal: Negative.    Skin: Negative for rash.   Neurological: Negative.    Hematological: Negative.    Psychiatric/Behavioral: Negative.      PHYSICAL EXAM:    Vitals:    08/18/17 1606   BP: 126/60   Pulse: 75   Resp: 13   Temp: 36.9 ?C (98.5 ?F)   SpO2: 98%     Body mass index is 27.41 kg/m?Marland Kitchen    Physical Exam   Constitutional: She is oriented to person, place, and time. She appears well-developed and well-nourished.   HENT:   Head: Normocephalic and atraumatic.   Eyes:   Examination of left thigh and a fluorescein stain does not reveal any corneal abrasions there is really no uptake except in the sclera on the bottom but is not the point of interest wears patient feels the pain which is on the lateral sclera of at approximately 9:00.   Musculoskeletal: Normal range of motion.   Neurological: She is oriented to person, place, and time.   Skin: Skin is warm. No rash noted.   Psychiatric: She has a normal mood and affect. Her behavior is normal. Judgment and  thought content normal.   Nursing note and vitals reviewed.    ASSESSMENT & PLAN:  Patient will start ofloxacin if not the feeling that her symptoms are resolving she is instructed to go and see an ophthalmologist at [facility] she will be given the phone number and contact information.    NOTE: Portions of this MEDICAL RECORD were completed using Dragon voice recognition software. While every effort was made to proofread this document, some transcription errors may remain.

## 2017-08-18 NOTE — Discharge Instructions (Signed)
Patient Education   Ofloxacin eye solution  What is this medicine?  OFLOXACIN (oh FLOKS a sin) is a quinolone antibiotic. It is used in the eye to treat bacterial infections.  This medicine may be used for other purposes; ask your health care provider or pharmacist if you have questions.  COMMON BRAND NAME(S): Ocuflox  What should I tell my health care provider before I take this medicine?  They need to know if you have any of these conditions:  -an unusual or allergic reaction to ofloxacin, fluoroquinolone antibiotics, other medicines, foods, dyes, or preservatives  -pregnant or trying to get pregnant  -breast-feeding  How should I use this medicine?  This medicine is only for use in the eye. Do not take by mouth. Follow the directions on the prescription label. Wash hands before and after use. Tilt your head back slightly and pull your lower eyelid down with your index finger to form a pouch. Try not to touch the tip of the dropper to your eye, fingertips, or any other surface. Squeeze the prescribed number of drops into the pouch. Close the eye gently to spread the drops. Your vision may blur for a few minutes. Use your doses at regular intervals. Do not use your medicine more often than directed. Finish the full course prescribed by your doctor or health care professional even if you think your condition is better.  Talk to your pediatrician regarding the use of this medicine in children. Special care may be needed.  Overdosage: If you think you have taken too much of this medicine contact a poison control center or emergency room at once.  NOTE: This medicine is only for you. Do not share this medicine with others.  What if I miss a dose?  If you miss a dose, use it as soon as you can. If it is almost time for your next dose, use only that dose. Do not use double or extra doses.  What may interact with this medicine?  Interactions are not expected. Do not use any other eye products without telling your doctor  or health care professional.  This list may not describe all possible interactions. Give your health care provider a list of all the medicines, herbs, non-prescription drugs, or dietary supplements you use. Also tell them if you smoke, drink alcohol, or use illegal drugs. Some items may interact with your medicine.  What should I watch for while using this medicine?  Tell your doctor or health care professional if your symptoms do not improve in 2 to 3 days.  If you get any sign of an allergic reaction, stop using your eye product and call your doctor or health care professional.  Wear sunglasses if this medicine makes your eyes more sensitive to light.  Do not wear contact lenses while you have any signs or symptoms of an eye infection. Ask your doctor or health care professional when you can start wearing your lenses again.  What side effects may I notice from receiving this medicine?  Side effects that you should report to your doctor or health care professional as soon as possible:  -blurred vision that does not go away  -burning, stinging, or itching of the eyes or eyelids  -redness, swelling, or pain of the eyes or eyelids  Side effects that usually do not require medical attention (report to your doctor or health care professional if they continue or are bothersome):  -temporary blurred vision  -tearing or feeling of something in the  eye  This list may not describe all possible side effects. Call your doctor for medical advice about side effects. You may report side effects to FDA at 1-800-FDA-1088.  Where should I keep my medicine?  Keep out of the reach of children.  Store at room temperature between 15 and 25 degrees C (59 and 77 degrees F). Throw away any unused eye solution after the expiration date.  NOTE: This sheet is a summary. It may not cover all possible information. If you have questions about this medicine, talk to your doctor, pharmacist, or health care provider.  ? 2018 Elsevier/Gold Standard  (2008-07-05 16:49:06)

## 2017-08-20 NOTE — Telephone Encounter (Signed)
Urgent Care Follow Up Call    Urgent Care Follow Up 08/20/2017   [facility] HOW IS THE PATIENT FEELING Pt states her eye is not better and is going to [facility].    [facility] FAMILY HAVE QUESTIONS No       Medications 08/20/2017   [facility] PT UNDERSTAND MEDS 1   [facility] PT FILLED MEDICATIONS Yes, all medications prescribed at discharge were filled       Follow Up 08/20/2017   [facility] FOLLOW UP APPOINTMENT Yes, with PCP   [facility] QUESTIONS CONCERNS Denies any questions or concerns at this time

## 2019-07-06 ENCOUNTER — Emergency Department: Admit: 2019-07-07 | Payer: PRIVATE HEALTH INSURANCE

## 2019-07-06 DIAGNOSIS — S82851A Displaced trimalleolar fracture of right lower leg, initial encounter for closed fracture: Secondary | ICD-10-CM

## 2019-07-06 NOTE — ED Notes (Signed)
Ortho PA- Damian Leavell called for Dr. Melvyn Neth.

## 2019-07-06 NOTE — ED Notes (Signed)
Bed: 108-01  Expected date:   Expected time:   Means of arrival:   Comments:  Ems ankle injury Cynthia Goodman

## 2019-07-06 NOTE — Pre Sedation (Signed)
Right ankle reduced, and splinted

## 2019-07-06 NOTE — Pre Sedation (Signed)
Pt screaming and grimacing.
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
## 2019-07-06 NOTE — ED Notes (Signed)
Pt's SO contacted to pick her up, he states he is about 45 minutes away. His name is [facility] [phone]

## 2019-07-06 NOTE — Pre Sedation (Signed)
Pt's eyes open, follows directions, and speaking in full sentences

## 2019-07-06 NOTE — Pre Sedation (Signed)
Post reduction films complete. Pt A&Ox4

## 2019-07-06 NOTE — ED Notes (Signed)
CMS remains intact after splint application.

## 2019-07-06 NOTE — Pre Sedation (Signed)
Pt beginning to awaken at this time.

## 2019-07-06 NOTE — Pre Sedation (Signed)
Pt sleepy but arousable. Pt speaks in full sentences, and follows commands

## 2019-07-06 NOTE — Pre Sedation (Signed)
MD beginning reduction, pt screamed in room. More propofol given

## 2019-07-06 NOTE — ED Provider Notes (Signed)
[facility] EMERGENCY DEPARTMENT PROVIDER NOTE      Name: Cynthia Goodman  DOB: [birth year] 28 y.o.  MRN: [id number]  CSN: [id number]      HISTORY:     CHIEF COMPLAINT    Ankle pain      HPI    Cynthia Goodman is a 28 y.o. female who presents with ankle pain.    The patient reports that she was otherwise at her normal baseline this evening when she was riding on a One Wheel motorized scooter/skateboard in her driveway when she lost her balance and fell off landing awkwardly on her right ankle.  She complains of severe pain in the right ankle.  She denies any other injuries.  No head trauma or loss of consciousness.  No neck or back pain.  No pain in her thorax, abdomen or other extremities.  She received 50 mcg of fentanyl in route.  She continues to complain of severe pain.    Otherwise reports being at their normal baseline with no recent fevers, chills or colds.      PAST MEDICAL HISTORY    Past Medical History:   Diagnosis Date   . Right foot injury 1997    placed into a cast - no surgery     SURGICAL HISTORY    None    CURRENT MEDICATIONS    None    ALLERGIES    Sulfa (sulfonamide antibiotics)    FAMILY HISTORY    Family History   Problem Relation Age of Onset   . Broken bones Brother    . Cancer Other      SOCIAL HISTORY    Social History     Tobacco Use   . Smoking status: Former Smoker     Years: 1.00     Last attempt to quit: 2013     Years since quitting: 7.5   . Smokeless tobacco: Never Used   Substance Use Topics   . Alcohol use: Yes     Alcohol/week: 5.0 - 6.0 standard drinks     Types: 4 - 5 Cans of beer, 1 Glasses of wine per week   . Drug use: Yes     Frequency: 1.0 times per week     Types: Marijuana     Comment: Medical Cannibis     REVIEW OF SYSTEMS:   10-pt Review of Systems was performed and is otherwise negative apart from that mentioned in HPI.    PHYSICAL EXAM:   VITAL SIGNS:  BP 113/63   Pulse 77   Temp 36.6 ?C (97.9 ?F) (Oral)   Resp 16   Wt 77.1 kg (170 lb)   LMP 06/09/2019    SpO2 100%   BMI 26.63 kg/m?   Constitutional:  Non-toxic, well appearing, 28 y.o. female appearing in acute pain.  HEENT:      Head: Normocephalic, Atraumatic.    Face: Atraumatic.  Neck:  Supple. C-spine non-TTP. Clavicles non-TTP.  Chest/Respiratory:  No resp distress. CTAB. No wheezing. No focal crackles.    Cardiovascular:  Regular rate, Regular Rhythm, No murmur  Abd:  Soft. Non-tender. Non-distended. No rebound. No Guarding.    Back: T-L spine non-TTP.  Pelvis: Stable to rock and compression  Extremities:     Left upper extremity: Shoulder, elbow, wrist and hand non-TTP. Atraumatic. Motor and sensation grossly intact.    Right upper extremity: Shoulder, elbow, wrist and hand non-TTP. Atraumatic. Motor and sensation grossly intact.    Left lower extremity: Hip, knee and ankle  non-TTP. Atraumatic. Motor and sensation grossly intact.    Right lower extremity: Hip and knee non-TTP.  Right ankle with diffuse tenderness to palpation.  Some overlying swelling.  No lacerations or abrasions.  Atraumatic. Motor and sensation grossly intact.  No proximal tip/fib tenderness to palpation.  Skin:  Warm. Dry. No rashes.   Neurologic:  Alert & oriented x3.  GCS 15. Speech and Language normal.    Psychiatric:  Affect normal. Mood normal.      ED COURSE & MEDICAL DECISION MAKING    In summary, Cynthia Goodman is a 28 y.o. female who presents with ankle pain.    My assessment reveals an awake, alert patient in no acute distress. Hemodynamically stable and afebrile. Exam reveals diffuse tenderness to palpation of the right ankle.  This appears to be an isolated traumatic injury.    Given the patient's history and exam, we elected to proceed with an x-ray of the right ankle.  X-ray shows evidence of a bimalleolar fracture.  The patient was consented for procedural sedation to facilitate splinting and slight reduction.  This was performed as noted in the procedure notes below without complication.  She tolerated this well.   She was neurovascular intact following splint placement.  I discussed case with PA [facility] of orthopedic surgery.  He is reviewed the x-rays and feels reduction is sufficient and will see the patient in follow-up.    Given the patient's workup, feel she is safe for discharge.  Have discussed with the patient results of workup, indications for return and need for orthopedic surgery follow up. They understand and agree with the plan.       Imaging:  X-ray right ankle: By my read, mildly displaced bimalleolar fracture.    X-ray right ankle: Post reduction: By my read, improved anatomic alignment of previously seen fracture.    X-ray right tib/fib: By my read, no evidence of proximal tip/fib injury.      PROCEDURE NOTE:  PROCEDURAL SEDATION  Performed by:  Dorethea Clan, MD  Indications: Right ankle fracture    Procedure Details   Informed consent was obtained from the patient after a discussion of risks, benefits and alternatives to the procedure, and a timeout was performed.  ASA Physical Status: 2. The patient's NPO status has been reviewed. An H&P has been completed. The patient was given 30 mg ketamine and 90 mg propofol with appropriate sedation.      Findings:  Patient recovered from sedation uneventfully and did not require airway intervention. Patient back to baseline mental status and able to tolerate oral fluids in the Emergency Department post procedure.      PROCEDURE NOTE:  ANKLE REDUCTION  Performed by: Dorethea Clan, MD  Procedure: Joint reduction  Indications: Right ankle fracture/dislocation    Procedure Details   Informed consent was obtained from the patient after a discussion of risks, benefits and alternatives to the procedure, and a timeout was performed. The patient was placed in a appropriate position. Sedation was provided as noted above. Using traction and countertraction, ankle relocation was attempted. There appeared to be good alignment with minimal effort. X-rays were obtained showing near  anatomic alignment. The ankle was subsequently splinted with a posterior short leg with stirrup. The patient was neurovascularly intact following splint placement. X-rays following splint placement showed near anatomic alignment. The patient tolerated the procedure quite well.     Findings:  Right ankle fracture dislocation/dislocation. Successful reduction.      IMPRESSION:  Right ankle  fracture      PLAN, DISPOSITION AND FOLLOW-UP:   Discharged in stable condition  Splint and crutches as applied/provided here  Nonweightbearing status  RICE recommendations  Norco as needed for pain  Tylenol and ibuprofen as needed for pain  Follow-up orthopedic surgery  Return precautions given       Loma Messing, MD  07/07/19 0006

## 2019-07-06 NOTE — Discharge Instructions (Signed)
Thanks for allowing [place] to care for you here at [facility] Emergency Department. Please follow the below instructions related to the care you received today.    - You have a broken right ankle.    - We have placed you in a splint. Please wear this, use crutches and don't bear weight on your right leg until seen in follow up by the orthopedic doctors.    - Call the orthopedic doctors to schedule a follow up appointment in 5-7 days.    - The keys to treatment are Rest, Ice, Compression and Elevation. RICE. Rest as needed. Ice five times a day for thirty minutes. Use a compressive dressing as needed to reduce swelling. Keep the affected limb elevated to prevent swelling.    - You have been given a prescription for pain medications (i.e. Norco). These medications can make you drowsy and affect your level of alertness. You should not drive, operate other heavy machinery or drink alcohol while using these medications.      - You can continue to use acetaminophen (Tylenol) and ibuprofen (Advil) unless you have been instructed otherwise by a doctor. Please follow the instructions on the packages. You should not take more than 3g of acetaminophen in any 24 hour period and no more than 650 mg per dose. Keep in mind that some prescription pain medications, including Norco, contain acetaminophen and this must be factored into the maximum daily allowances. The maximum amount of ibuprofen for adults is 800 mg per dose and 3200 mg in any 24 hour period, but we recommend no more than 600mg  at a time with a total of 2400 mg a day as it can be hard on your stomach.    - Pain medications cannot be refilled in the Emergency Department. You will need to schedule follow up with your primary care doctor to discuss further management of ongoing pain.    - With these medications, it is quite common to become constipated. You should take Miralax 1-2 times daily to prevent constipation while taking these pain medications.    -  Return to the ER if you are having worsening pain, numbness, tingling or any other symptoms of concern.     - Otherwise, please follow up with the orthopedic doctors for further care.      Thanks.  Dorethea Clan, MD

## 2019-07-06 NOTE — Pre Sedation (Signed)
Imaging at bedside for post reduction films

## 2019-07-07 ENCOUNTER — Inpatient Hospital Stay
Admit: 2019-07-07 | Discharge: 2019-07-07 | Disposition: A | Discharge status: Home / Self Care | Payer: PRIVATE HEALTH INSURANCE | Attending: MD

## 2019-07-07 MED ORDER — fentaNYL (PF) (SUBLIMAZE) injection 100 mcg
50 | Freq: Once | INTRAMUSCULAR | Status: AC
Start: 2019-07-07 — End: 2019-07-06
  Administered 2019-07-07: 05:00:00 50 ug via INTRAVENOUS

## 2019-07-07 MED ORDER — HYDROcodone-acetaminophen (NORCO) 5-325 mg per tablet
5-325 | ORAL_TABLET | ORAL | 0 refills | 30.00000 days | Status: DC | PRN
Start: 2019-07-07 — End: 2019-07-13

## 2019-07-07 MED ORDER — ketamine (KETALAR) 10 mg/mL syringe
10 | INTRAVENOUS | Status: AC | PRN
Start: 2019-07-07 — End: 2019-07-06
  Administered 2019-07-07 (×2): 10 mg/mL via INTRAVENOUS

## 2019-07-07 MED ORDER — propofoL (DIPRIVAN) injection
10 | INTRAVENOUS | Status: AC | PRN
Start: 2019-07-07 — End: 2019-07-06
  Administered 2019-07-07 (×3): 10 mg/mL via INTRAVENOUS

## 2019-07-07 MED ORDER — HYDROcodone-acetaminophen tablet 5-325 mg (starter pack)(6 tabs)
5-325 | Freq: Four times a day (QID) | ORAL | Status: DC | PRN
Start: 2019-07-07 — End: 2019-07-07
  Administered 2019-07-07: 08:00:00 5-325 via ORAL

## 2019-07-07 MED ORDER — propofoL (DIPRIVAN) injection 100 mg
10 | Freq: Once | INTRAVENOUS | Status: DC
Start: 2019-07-07 — End: 2019-07-07

## 2019-07-07 MED ORDER — ketamine (KETALAR) injection 50 mg
10 | Freq: Once | INTRAMUSCULAR | Status: DC
Start: 2019-07-07 — End: 2019-07-07

## 2019-07-07 MED FILL — KETAMINE 10 MG/ML INJECTION SOLUTION: 10 mg/mL | INTRAMUSCULAR | Qty: 20

## 2019-07-07 MED FILL — FENTANYL (PF) 50 MCG/ML INJECTION SOLUTION: 50 ug/mL | INTRAMUSCULAR | Qty: 2

## 2019-07-07 MED FILL — PROPOFOL 10 MG/ML INTRAVENOUS EMULSION: 10 mg/mL | INTRAVENOUS | Qty: 20

## 2019-07-07 MED FILL — HYDROCODONE-ACETAMINOPHEN 5 MG-325 MG TABLET (#6)(SP): 5-325 mg | ORAL | Qty: 1

## 2019-07-07 NOTE — Telephone Encounter (Signed)
Patient called in stating that she was referred to the clinic for a broken ankle. Patient was seen in the ED yesterday 07/06/2019.      Please call back at [phone]  Okay to leave a detailed message.

## 2019-07-07 NOTE — ED Notes (Addendum)
20g LAC IV removed. Tip intact. Therapy complete.

## 2019-07-08 NOTE — Telephone Encounter (Signed)
I called and explained to the patient that we have gotten things cued up in the event she will need surgery the appointments and everything is all set and if not we will just cancel them. She was thankful for the call and explanation. I let her know that Dr Percell Belt will be seeing her on 7/20/220 and will explain and discuss everything with her at that time. She is agreeable to this plana dn will be at her appointment on 07/12/2019

## 2019-07-08 NOTE — Addendum Note (Signed)
Addended by: Christy Gentles on: 07/12/2019 11:23 AM     Modules accepted: Orders

## 2019-07-08 NOTE — Telephone Encounter (Signed)
Patient called stating she is unaware of the upcoming appointments. They were not made by her.    She is wanting to know if she is scheduled for surgery, because of the post op appts she saw on her MyChart account.    Please call her to clarify at [phone]. Okay to leave a message.

## 2019-07-12 ENCOUNTER — Other Ambulatory Visit: Admit: 2019-07-12 | Discharge: 2019-07-12 | Payer: PRIVATE HEALTH INSURANCE

## 2019-07-12 ENCOUNTER — Ambulatory Visit: Admit: 2019-07-12 | Payer: PRIVATE HEALTH INSURANCE | Attending: MD

## 2019-07-12 DIAGNOSIS — S82891A Other fracture of right lower leg, initial encounter for closed fracture: Secondary | ICD-10-CM

## 2019-07-12 LAB — SARS-COV-2 (COVID-19), SCREENING/ASYMPTOMATIC UNEXPOSED: SARS-CoV-2 (COVID-19) by RT-PCR: NOT DETECTED

## 2019-07-12 NOTE — Procedures (Signed)
H&P with PARQ in chart review notes 6/20. Consent complete in chart review scans and media. Orders in place.    Patient denies chest pain, orthopnea, or difficulty going up a flight of stairs. Pt denies cough, fever, respiratory symptoms.  Also denies recent travels, exposure to persons with COVID-19.    Patient completing cv-2 Monday 7/20 at [place] in gp

## 2019-07-12 NOTE — Progress Notes (Signed)
Review of Systems   All other systems reviewed and are negative.

## 2019-07-12 NOTE — H&P (Signed)
Cynthia Goodman is a 28 y.o. female.  Chief Complaint   Patient presents with   . Ankle Injury     New patient right ankle fracture         HISTORY OF PRESENT ILLNESS     Very pleasant 28 year old female presenting with an injury to the right ankle when she landed awkwardly falling off of a 1 wheel motorized skateboard in her driveway on the 14th of this month.  She was seen in the emergency department and reduction and splinting was done.  She has remained touchdown weightbearing.  She is keeping the extremity elevated.    No chest pain or shortness of breath.     No distal numbness or tingling.  She is generally quite active including hiking and running.  She even rides her single wheel skateboard offroad.  No prior history of difficulty with the ankle.  Denies any other associated injury today.  No isolated calf pain.  No chest pain or shortness of breath.    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    Currently she is using Vicodin and ibuprofen.  Approximately 1 tab of hydrocodone daily.      No problems updated.  Past Medical History:   Diagnosis Date   . Right foot injury 1997    placed into a cast - no surgery     Family History   Problem Relation Age of Onset   . Broken bones Brother    . Cancer Other      History reviewed. No pertinent surgical history.  Allergies   Allergen Reactions   . Sulfa (Sulfonamide Antibiotics) Other (See Comments)     irritation     Social History     Socioeconomic History   . Marital status: Single     Spouse name: Not on file   . Number of children: Not on file   . Years of education: Not on file   . Highest education level: Not on file   Tobacco Use   . Smoking status: Former Smoker     Years: 1.00     Last attempt to quit: 2013     Years since quitting: 7.5   . Smokeless tobacco: Never Used   Substance and Sexual Activity   . Alcohol use: Yes     Alcohol/week: 5.0 - 6.0 standard drinks     Types: 1  Glasses of wine, 4 - 5 Cans of beer per week   . Drug use: Yes     Frequency: 1.0 times per week     Types: Marijuana     Comment: Medical Cannibis   . Sexual activity: Yes     Partners: Male     Birth control/protection: I.U.D.     Comment: Copper IUD   Other Topics Concern   . Caffeine Concern Yes   . Exercise Yes       REVIEW OF SYSTEMS   Review of Systems   All other systems reviewed and are negative.      PHYSICAL EXAM   Temp 37 ?C (98.6 ?F) (Temporal)   Ht 5' 7 (1.702 m)   Wt 171 lb (77.6 kg)   LMP 07/01/2019 (Exact Date)   BMI 26.78 kg/m?   Body mass index is 26.78 kg/m?Marland Kitchen    No acute distress; alert and oriented and cooperative    HEENT: NC/AT  Neck: Supple, no masses  appreciated  Lymph: No abnormal lymph nodes  Resp: equal breath sounds, no wheezing  CV: S1, S2, RRR  Abdomen: Soft, NT, ND    On evaluation of the right ankle there is no significant clinical deformity.  Mild swelling.  Minimal bruising.  No wounds.  Splint was not completely taken down today but soft tissues visible appear benign.  No midfoot tenderness.  Calf is nontender without cords.    DP pulse palpable.  Foot is warm and well-perfused  Capillary refill is less than 2 seconds  Sensation is intact to light touch in the superficial peroneal, deep peroneal, sural, saphenous, and posterior tibial distributions.   Is able to wiggle the toes and fire the EHL without difficulty          IMAGING STUDY     Results for orders placed in visit on 07/12/19   XR ANKLE RIGHT AP LATERAL AND OBLIQUE 07/12/2019 07/12/2019    Narrative Plain radiographs of the right ankle.  Comminuted fibula fracture is seen just above the level of the syndesmosis.  Displaced medial malleolar fracture is noted.  Syndesmotic disruption is suggested.  No definitive osteochondral defect of the talus is seen.         ASSESSMENT        ICD-10-CM    1. Closed fracture of right ankle, initial encounter S82.891A SARS-CoV-2 (COVID-19), Screening/Asymptomatic Unexposed   2. Right  ankle pain, unspecified chronicity M25.571 X-ray ankle right AP lateral and oblique   3. Pre-op exam Z01.818 SARS-CoV-2 (COVID-19), Screening/Asymptomatic Unexposed          PLAN          Given her degree of displacement and functional status I did recommend surgical intervention.   I did recommend additionally an ankle arthroscopy given the increased incidence of articular defects and syndesmotic injury in this pattern of fracture.    The PARQ was held and the consent was freely signed for right ankle open reduction internal fixation, possible syndesmotic fixation, and ankle arthroscopy    Risks and benefits of surgery were discussed in detail, risks including, but not limited to infection, bleeding, scarring, damage to nerves or blood vessels, need for further surgery including implant removal, nonunion, malunion, ongoing pain, limp, or dysfunction and deformity, development of arthritis, the risk of blood clots and other complications of anesthesia and operative intervention up to and including death.      The rehabilitation process and timeline were discussed as well as postoperative weightbearing restrictions.  Need for physical therapy to be determined.    Perioperative pain control: Standard multimodal    Preop antibiotic will be Ancef    VTE prophylaxis: 325 mg daily aspirin SCDs during admission  VTE warning signs and action plan discussed today  She is taking a flight 4 weeks postoperatively.  She is going to continue her aspirin and move about frequently and we discussed this as a possible risk factor for VTE development.  No other risk factors identified    Will anticipate discharge to: Home with her husband    Preoperative labs: COVID screen    She has crutches and is comfortable using them    All questions were solicited and answered and I would be happy to see Cynthia Goodman back at any time with further questions or concerns.               Current Outpatient Medications on File Prior to Visit      Medication Sig Dispense Refill   .  HYDROcodone-acetaminophen (NORCO) 5-325 mg per tablet Take 1 tablet by mouth every 4 hours as needed for Pain. 10 tablet 0   . ofloxacin (OCUFLOX) 0.3 % ophthalmic solution 1 drop 3 times daily into the left eye. Repeat for 5 days. 5 mL 0     No current facility-administered medications on file prior to visit.        There are no Patient Instructions on file for this visit.    No follow-ups on file.    *This note was dictated using voice recognition software. If you have any questions concerning the content, please call our office at [phone].*

## 2019-07-12 NOTE — H&P (View-Only) (Signed)
Cynthia Goodman is a 28 y.o. female.  Chief Complaint   Patient presents with   . Ankle Injury     New patient right ankle fracture         HISTORY OF PRESENT ILLNESS     Very pleasant 28-year-old female presenting with an injury to the right ankle when she landed awkwardly falling off of a 1 wheel motorized skateboard in her driveway on the 14th of this month.  She was seen in the emergency department and reduction and splinting was done.  She has remained touchdown weightbearing.  She is keeping the extremity elevated.    No chest pain or shortness of breath.     No distal numbness or tingling.  She is generally quite active including hiking and running.  She even rides her single wheel skateboard offroad.  No prior history of difficulty with the ankle.  Denies any other associated injury today.  No isolated calf pain.  No chest pain or shortness of breath.    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    Currently she is using Vicodin and ibuprofen.  Approximately 1 tab of hydrocodone daily.      No problems updated.  Past Medical History:   Diagnosis Date   . Right foot injury 1997    placed into a cast - no surgery     Family History   Problem Relation Age of Onset   . Broken bones Brother    . Cancer Other      History reviewed. No pertinent surgical history.  Allergies   Allergen Reactions   . Sulfa (Sulfonamide Antibiotics) Other (See Comments)     irritation     Social History     Socioeconomic History   . Marital status: Single     Spouse name: Not on file   . Number of children: Not on file   . Years of education: Not on file   . Highest education level: Not on file   Tobacco Use   . Smoking status: Former Smoker     Years: 1.00     Last attempt to quit: 2013     Years since quitting: 7.5   . Smokeless tobacco: Never Used   Substance and Sexual Activity   . Alcohol use: Yes     Alcohol/week: 5.0 - 6.0 standard drinks     Types: 1  Glasses of wine, 4 - 5 Cans of beer per week   . Drug use: Yes     Frequency: 1.0 times per week     Types: Marijuana     Comment: Medical Cannibis   . Sexual activity: Yes     Partners: Male     Birth control/protection: I.U.D.     Comment: Copper IUD   Other Topics Concern   . Caffeine Concern Yes   . Exercise Yes       REVIEW OF SYSTEMS   Review of Systems   All other systems reviewed and are negative.      PHYSICAL EXAM   Temp 37 ?C (98.6 ?F) (Temporal)   Ht 5' 7 (1.702 m)   Wt 171 lb (77.6 kg)   LMP 07/01/2019 (Exact Date)   BMI 26.78 kg/m?   Body mass index is 26.78 kg/m?.    No acute distress; alert and oriented and cooperative    HEENT: NC/AT  Neck: Supple, no masses   appreciated  Lymph: No abnormal lymph nodes  Resp: equal breath sounds, no wheezing  CV: S1, S2, RRR  Abdomen: Soft, NT, ND    On evaluation of the right ankle there is no significant clinical deformity.  Mild swelling.  Minimal bruising.  No wounds.  Splint was not completely taken down today but soft tissues visible appear benign.  No midfoot tenderness.  Calf is nontender without cords.    DP pulse palpable.  Foot is warm and well-perfused  Capillary refill is less than 2 seconds  Sensation is intact to light touch in the superficial peroneal, deep peroneal, sural, saphenous, and posterior tibial distributions.   Is able to wiggle the toes and fire the EHL without difficulty          IMAGING STUDY     Results for orders placed in visit on 07/12/19   XR ANKLE RIGHT AP LATERAL AND OBLIQUE 07/12/2019 07/12/2019    Narrative Plain radiographs of the right ankle.  Comminuted fibula fracture is seen just above the level of the syndesmosis.  Displaced medial malleolar fracture is noted.  Syndesmotic disruption is suggested.  No definitive osteochondral defect of the talus is seen.         ASSESSMENT        ICD-10-CM    1. Closed fracture of right ankle, initial encounter S82.891A SARS-CoV-2 (COVID-19), Screening/Asymptomatic Unexposed   2. Right  ankle pain, unspecified chronicity M25.571 X-ray ankle right AP lateral and oblique   3. Pre-op exam Z01.818 SARS-CoV-2 (COVID-19), Screening/Asymptomatic Unexposed          PLAN          Given her degree of displacement and functional status I did recommend surgical intervention.   I did recommend additionally an ankle arthroscopy given the increased incidence of articular defects and syndesmotic injury in this pattern of fracture.    The PARQ was held and the consent was freely signed for right ankle open reduction internal fixation, possible syndesmotic fixation, and ankle arthroscopy    Risks and benefits of surgery were discussed in detail, risks including, but not limited to infection, bleeding, scarring, damage to nerves or blood vessels, need for further surgery including implant removal, nonunion, malunion, ongoing pain, limp, or dysfunction and deformity, development of arthritis, the risk of blood clots and other complications of anesthesia and operative intervention up to and including death.      The rehabilitation process and timeline were discussed as well as postoperative weightbearing restrictions.  Need for physical therapy to be determined.    Perioperative pain control: Standard multimodal    Preop antibiotic will be Ancef    VTE prophylaxis: 325 mg daily aspirin SCDs during admission  VTE warning signs and action plan discussed today  She is taking a flight 4 weeks postoperatively.  She is going to continue her aspirin and move about frequently and we discussed this as a possible risk factor for VTE development.  No other risk factors identified    Will anticipate discharge to: Home with her husband    Preoperative labs: COVID screen    She has crutches and is comfortable using them    All questions were solicited and answered and I would be happy to see Naysa Hohman back at any time with further questions or concerns.               Current Outpatient Medications on File Prior to Visit      Medication Sig Dispense Refill   .   HYDROcodone-acetaminophen (NORCO) 5-325 mg per tablet Take 1 tablet by mouth every 4 hours as needed for Pain. 10 tablet 0   . ofloxacin (OCUFLOX) 0.3 % ophthalmic solution 1 drop 3 times daily into the left eye. Repeat for 5 days. 5 mL 0     No current facility-administered medications on file prior to visit.        There are no Patient Instructions on file for this visit.    No follow-ups on file.    *This note was dictated using voice recognition software. If you have any questions concerning the content, please call our office at [phone].*

## 2019-07-13 ENCOUNTER — Ambulatory Visit: Admit: 2019-07-13 | Payer: PRIVATE HEALTH INSURANCE

## 2019-07-13 MED ORDER — senna-docusate (PERICOLACE) 8.6-50 mg per tablet
8.6-50 | ORAL_TABLET | Freq: Every day | ORAL | 0 refills | 27.50000 days | Status: AC
Start: 2019-07-13 — End: 2019-07-27

## 2019-07-13 MED ORDER — aspirin 325 MG tablet
325 | ORAL_TABLET | Freq: Every day | ORAL | 0 refills | Status: AC
Start: 2019-07-13 — End: 2019-08-24

## 2019-07-13 MED ORDER — ceFAZolin (ANCEF) 2 g in D5W IVPB Premix
2 | Freq: Once | INTRAVENOUS | Status: AC
Start: 2019-07-13 — End: 2019-07-13
  Administered 2019-07-13: 19:00:00 2 g via INTRAVENOUS

## 2019-07-13 MED ORDER — ondansetron (ZOFRAN) injection 4 mg
4 | Freq: Four times a day (QID) | INTRAMUSCULAR | Status: DC | PRN
Start: 2019-07-13 — End: 2019-07-13

## 2019-07-13 MED ORDER — lidocaine 20 mg/mL (2 %) injection
20 | INTRAMUSCULAR | Status: DC | PRN
Start: 2019-07-13 — End: 2019-07-13
  Administered 2019-07-13: 19:00:00 20 mg/mL (2 %) via INTRAVENOUS

## 2019-07-13 MED ORDER — midazolam (PF) injection
5 | INTRAMUSCULAR | Status: DC | PRN
Start: 2019-07-13 — End: 2019-07-13
  Administered 2019-07-13: 18:00:00 5 mg/mL via INTRAVENOUS

## 2019-07-13 MED ORDER — propofoL (DIPRIVAN) injection
10 | INTRAVENOUS | Status: DC | PRN
Start: 2019-07-13 — End: 2019-07-13
  Administered 2019-07-13: 19:00:00 10 mg/mL via INTRAVENOUS

## 2019-07-13 MED ORDER — fentaNYL (PF) (SUBLIMAZE) injection 50-100 mcg
50 | INTRAMUSCULAR | Status: DC | PRN
Start: 2019-07-13 — End: 2019-07-13

## 2019-07-13 MED ORDER — bupivacaine-epinephrine (PF) 0.25 %-1:200,000 injection
0.25 | INTRAMUSCULAR | Status: DC | PRN
Start: 2019-07-13 — End: 2019-07-13
  Administered 2019-07-13: 19:00:00 0.25 %-1:200,000 via PERINEURAL

## 2019-07-13 MED ORDER — Lactated Ringers irrigation
Status: DC | PRN
Start: 2019-07-13 — End: 2019-07-13
  Administered 2019-07-13: 19:00:00

## 2019-07-13 MED ORDER — bupivacaine-epinephrine (PF) 0.5 %-1:200,000 injection
0.5 | INTRAMUSCULAR | Status: DC | PRN
Start: 2019-07-13 — End: 2019-07-13
  Administered 2019-07-13: 18:00:00 0.5 %-1:200,000 via PERINEURAL

## 2019-07-13 MED ORDER — fentaNYL (PF) (SUBLIMAZE) injection 25-50 mcg
50 | Freq: Once | INTRAMUSCULAR | Status: DC
Start: 2019-07-13 — End: 2019-07-13

## 2019-07-13 MED ORDER — HYDROmorphone (PF) (DILAUDID) injection 0.2-0.4 mg
0.5 | INTRAMUSCULAR | Status: DC | PRN
Start: 2019-07-13 — End: 2019-07-13

## 2019-07-13 MED ORDER — HYDROmorphone (PF) (DILAUDID) injection 0.5-0.8 mg
0.5 | INTRAMUSCULAR | Status: DC | PRN
Start: 2019-07-13 — End: 2019-07-13

## 2019-07-13 MED ORDER — dexamethasone (DECADRON) injection
4 | INTRAMUSCULAR | Status: DC | PRN
Start: 2019-07-13 — End: 2019-07-13
  Administered 2019-07-13: 19:00:00 4 mg/mL via INTRAVENOUS

## 2019-07-13 MED ORDER — HYDROcodone-acetaminophen (NORCO) 5-325 mg per tablet
5-325 | ORAL_TABLET | Freq: Four times a day (QID) | ORAL | 0 refills | 30.00000 days | Status: DC | PRN
Start: 2019-07-13 — End: 2019-07-21

## 2019-07-13 MED ORDER — midazolam (PF) (VERSED) 1 mg/mL injection
1 | INTRAMUSCULAR | Status: AC
Start: 2019-07-13 — End: ?

## 2019-07-13 MED ORDER — meperidine (PF) (DEMEROL) injection 12.5 mg
25 | INTRAMUSCULAR | Status: DC | PRN
Start: 2019-07-13 — End: 2019-07-13

## 2019-07-13 MED ORDER — sodium chloride (NS) 0.9% irrigation
0.9 | Status: DC | PRN
Start: 2019-07-13 — End: 2019-07-13
  Administered 2019-07-13: 19:00:00 0.9 % irrigation

## 2019-07-13 MED ORDER — ondansetron (ZOFRAN) injection
4 | INTRAMUSCULAR | Status: DC | PRN
Start: 2019-07-13 — End: 2019-07-13
  Administered 2019-07-13: 20:00:00 4 mg/2 mL via INTRAVENOUS

## 2019-07-13 MED ORDER — metoclopramide HCl (REGLAN) injection
5 | INTRAMUSCULAR | Status: DC | PRN
Start: 2019-07-13 — End: 2019-07-13
  Administered 2019-07-13: 19:00:00 5 mg/mL via INTRAVENOUS

## 2019-07-13 MED ORDER — fentaNYL (PF) (SUBLIMAZE) injection 25-50 mcg
50 | INTRAMUSCULAR | Status: DC | PRN
Start: 2019-07-13 — End: 2019-07-13

## 2019-07-13 MED ORDER — lactated Ringers infusion
INTRAVENOUS | Status: DC
Start: 2019-07-13 — End: 2019-07-13
  Administered 2019-07-13 (×2): via INTRAVENOUS

## 2019-07-13 MED FILL — FENTANYL (PF) 50 MCG/ML INJECTION SOLUTION: 50 ug/mL | INTRAMUSCULAR | Qty: 2

## 2019-07-13 MED FILL — MIDAZOLAM (PF) 1 MG/ML INJECTION SOLUTION: 1 mg/mL | INTRAMUSCULAR | Qty: 2

## 2019-07-13 MED FILL — CEFAZOLIN 2 GRAM/50 ML IN DEXTROSE (ISO-OSMOTIC) INTRAVENOUS PIGGYBACK: 2 gram/50 mL | INTRAVENOUS | Qty: 50

## 2019-07-13 NOTE — Anesthesia Procedure Notes (Signed)
Peripheral Block    Patient location during procedure: pre-op/SSU  Reason for block: procedure for pain, at surgeon's request and post-op pain management  Staffing  Anesthesiologist: Larita Fife, MD  Preanesthetic Checklist  Completed: patient identified, site marked, surgical consent, pre-op evaluation, timeout performed, IV checked, risks and benefits discussed and monitors and equipment checked  Peripheral Block  Patient position: supine  Patient monitoring: heart rate, EKG, continuous pulse ox and frequent blood pressure checks  Block type: popliteal  Laterality: right  Injection technique: single-shot  Procedures: ultrasound guided and nerve stimulator  Ultrasound Description: See epic for copy of [place] image.   Local infiltration: with epi 1:200k and bupivacaine  Infiltration strength: 0.5 %  Dose: 30 mL  Nerve Stimulator or Paresthesia  Response: Loss dorsiflexion, 0.5 mA,   Needle  Needle type: Stimuplex   Needle length: 4 in  Test dose: negative  Assessment  Injection assessment: negative aspiration for heme, no paresthesia on injection, incremental injection and local visualized surrounding nerve on ultrasound  Paresthesia pain: none  Heart rate change: no  Slow fractionated injection: yes

## 2019-07-13 NOTE — Anesthesia Post-Procedure Evaluation (Signed)
Patient Name: Cynthia Goodman  Procedures performed: Procedure(s):  OPEN REDUCTION INTERNAL FIXATION ANKLE  OPEN REDUCTION INTERNAL FIXATION DISTAL TIBIOFIBULAR JOINT SYNDESMOSIS DISRUPTION  ANKLE ARTHROSCOPY    Last Vitals:   Vitals Value Taken Time   BP 110/65 07/13/2019  3:20 PM   Pulse 74 07/13/2019  3:25 PM   Resp 16 07/13/2019  3:25 PM   SpO2 100 % 07/13/2019  3:25 PM   Temp 36.2 ?C 07/13/2019  2:10 PM       Planned Anesthesia Type: general, PNB  Final Anesthesia Type: general, PNB  Patients Current Location: PACU  Level of Consciousness: awake, responds to stimulation and alert  Post Procedure Pain:adequate analgesia  Airway: Patent  Respiratory Status: room air  Cardio Status: hemodynamically stable  Hydration: adequately hydrated  PONV prophylaxis Ordered  PONV? NO  no anesthesia complication,       planned opioid use           The patient was able to participate in the post op evaluation, but has not recovered from regional anesthetic and is expected to recover within 48 hours    Comments:      Larita Fife, MD  3:40 PM

## 2019-07-13 NOTE — Anesthesia Pre-Procedure Evaluation (Signed)
Anesthesia Evaluation     Patient summary reviewed and Nursing notes reviewed    No history of anesthetic complications     Airway   Dental      Pulmonary - negative ROS   Cardiovascular - negative ROS    Neuro/Psych - negative ROS     GI/Hepatic/Renal - negative ROS     Endo/Other      Comments: MJ use.   Musculoskeletal        Comments: Right ankle fracture.                Anesthesia Plan    ASA 2     general and PNB     intravenous and inhalational induction     Patient is not a smoker

## 2019-07-13 NOTE — Brief Op Note (Signed)
Brief Operative Note    Procedure(s) (LRB):  OPEN REDUCTION INTERNAL FIXATION ANKLE (Right)  OPEN REDUCTION INTERNAL FIXATION DISTAL TIBIOFIBULAR JOINT SYNDESMOSIS DISRUPTION (Right)  ANKLE ARTHROSCOPY (Right)     Preoperative Diagnoses:   Closed fracture of malleolus of right ankle, initial encounter [S82.891A]    Postoperative Diagnoses:  Post-Op Diagnosis Codes:     * Closed fracture of malleolus of right ankle, initial encounter [Z61.096E]     Surgeons: Surgeon(s) and Role:     * Christy Gentles, MD - Primary    Assistant(s): Physician Assistant: Nanetta Batty, PA-C     Anesthesia Provider: Anesthesiologist: Larita Fife, MD    Anesthesia Type: General    Height & Weight:5' 7 (1.702 m) & 176 lb (79.8 kg)     Specimens: none           Implants:  Implant Name Type Inv. Item Serial No. Manufacturer Lot No. LRB No. Used Action   PLATE TUBULAR 1/3 10H W/COLLAR LCP - [id number] Plate PLATE TUBULAR 1/3 10H W/COLLAR LCP  [employer] ([place])  Right 1 Implanted   SCREW LOCK 3.5X14MM SD SLF-TAP SS - [id number] Screw SCREW LOCK 3.5X14MM SD SLF-TAP SS  [employer] ([place])  Right 1 Implanted   SCREW LOCK 3.5X18MM SD SLF-TAP SS - [id number] Screw SCREW LOCK 3.5X18MM SD SLF-TAP SS  [employer] ([place])  Right 1 Implanted   SCREW CORT 3.5X12MM SLF-TAP SS - [id number] Screw SCREW CORT 3.5X12MM SLF-TAP SS  [employer] ([place])  Right 2 Implanted   SCREW CORT 3.5X14MM SLF-TAP SS - [id number] Screw SCREW CORT 3.5X14MM SLF-TAP SS  DE[employer] ([place])  Right 2 Implanted   SCREW CORT 3.5X16MM SLF-TAP SS - [id number] Screw SCREW CORT 3.5X16MM SLF-TAP SS  DE[employer] ([place])  Right 1 Implanted   SCREW CORT 3.5X45MM SLF-TAP SS - [id number] Screw SCREW CORT 3.5X45MM SLF-TAP SS  DEPU[employer] ([place])  Right 1 Implanted   ANCHOR LIGAMENT KNOTLESS - [id number]  ANCHOR LIGAMENT KNOTLESS  [employer] ([place]) [id number] Right 1 Implanted       Antibiotics (admin):   Recent Abx  Admin                   ceFAZolin (ANCEF) 2 g in D5W IVPB Premix (g) 2 g Given 07/13/19 1143                Incision Time:   Anes Incision Time     Date Time Event    07/13/2019 1205 Incision / Procedure Start           Timeout Completed:  Timeouts     Romie Levee, RN at [facility] Jul 13, 2019 1155 PDT     Timeout Details     Timeout type:  Pre-incision                Michaell Cowing, RN at [facility] Jul 13, 2019 1345 PDT     Timeout Details     Timeout type:  Sign-out                       Estimated Blood Loss: Minimal - 07/13/2019 12:05 PM to 07/13/2019  1:58 PM *    Urethral Catheter:      Findings: Small scuffing type medial talar dome lesion; ICRS grade 1; reduced syndesmosis after fixation    Complications: None     Disposition: PACU - hemodynamically stable.  Christy Gentles

## 2019-07-13 NOTE — OR Nursing (Signed)
Discharge instructions and RX provided and explained to pt and S/O.

## 2019-07-13 NOTE — Anesthesia Procedure Notes (Signed)
Peripheral Block    Patient location during procedure: pre-op/SSU  Reason for block: procedure for pain, at surgeon's request and post-op pain management  Staffing  Anesthesiologist: Larita Fife, MD  Performed: Anesthesiologist   Preanesthetic Checklist  Completed: patient identified, site marked, surgical consent, pre-op evaluation, timeout performed, IV checked, risks and benefits discussed and monitors and equipment checked  Peripheral Block  Patient position: supine  Prep: ChloraPrep  Patient monitoring: heart rate, continuous pulse ox, frequent blood pressure checks and EKG  Block type: adductor canal  Laterality: right  Injection technique: single-shot  Procedures: ultrasound guided  Ultrasound Description: See epic for copy of [place] image.   Local infiltration: bupivacaine and with epi 1:200k  Infiltration strength: 0.25 %  Dose: 20 mL  Needle  Needle type: Stimuplex   Needle length: 4 in  Test dose: negative  Assessment  Injection assessment: negative aspiration for heme, no paresthesia on injection, incremental injection and local visualized surrounding nerve on ultrasound  Paresthesia pain: none  Heart rate change: no  Slow fractionated injection: yes

## 2019-07-13 NOTE — Op Note (Signed)
DATE OF PROCEDURE:  07/13/2019    PROCEDURES PERFORMED:  1.  Open reduction and internal fixation of right ankle.  2.  Open reduction internal fixation, distal tibiofibular joint disruption,   right.  3.  Right ankle arthroscopy with limited debridement.    PREOPERATIVE DIAGNOSIS:  Closed fracture of right ankle, initial encounter.    POSTOPERATIVE DIAGNOSIS:  Closed fracture of right ankle, initial encounter.    SURGEON:  Christy Gentles, MD.    ASSISTANT:  Nanetta Batty, PA-C.    ANESTHESIA:  General and peripheral nerve blocks.  Please see their note for   full details.    IMPLANTS:  All from the [employer] small fragment system.    ESTIMATED BLOOD LOSS:  Minimal.    ANTIBIOTICS:  2 grams of Ancef prior to incision.    FINDINGS:  Small scuffing type medial talar dome lesion, ICRS grade 1,   dimensions 4 x 4 mm, reduced syndesmosis after fixation, evidence of anterior   and central syndesmotic ligament rupture.    COMPLICATIONS:  None apparent.    INDICATIONS FOR PROCEDURE:  This a very pleasant 28 year old female presenting   with an injury to the right ankle as described in my clinic notes.  Given the   degree of instability, I did recommend surgical intervention.  Risks and   benefits were extensively discussed.  Also, given the pattern of injury and her   young age, I did recommend an ankle arthroscopy concomitantly given the   increased incidence of articular cartilage lesions in this pattern.  She   consented to the procedure after the PARQ was held.  Consent was freely signed.    PROCEDURE IN DETAIL:  The patient was correctly identified in the preoperative   holding area by her wrist band.  Informed consent was confirmed, site was   marked.  She was taken back to the operating room and placed in a supine   position.  General endotracheal anesthesia was administered.  Immediately prior   to procedure, a preoperative pause was held in accordance with the institutional  protocol.  Antibiotic administration was  confirmed.  Right lower extremity   prepped and draped.  We then elevated and exsanguinated the limb.       Initially we identified all of our landmarks about   the ankle and marked out and neurovascular structures in the joint line.  We   then established a medial portal for our arthroscopy just off the tibialis   anterior tendon medially.  This was a nick and spread technique with a small   incision and blunt dissection down to the joint capsule.  After insufflation   with normal saline, introduced the arthroscope.  Diagnostic arthroscopy was   carried out.  Joint was cleared of all hematoma with repeat flushing.  Under   direct visualization inside the safe zone laterally, we established a lateral   portal with a nick and spread technique, introduced the shaver, debrided   synovitis and hematomatous debris at the anterior aspect for visualization.  We   were able to visualize the syndesmosis in the lateral gutter and the entirety of  the distal tibial surface and the talar dome.  At the medial aspect of the talar  dome in the anterolateral portion, there was a small scuffing type lesion with   the dimensions as above.  This was probed and was not unstable.  A small amount   of superficial fibrillated cartilage was debrided with a shaver.  Fluid was then  evacuated from the ankle.    We then marked out a  standard incision to the fibula, carried this down through the skin.  Blunt   dissection used down to the peroneal fascia.  Fascia was incised, peroneals   delivered.  Fracture site identified, provisionally clamped and held with   Kirschner wires, checked on orthogonal fluoroscopic views and direct   visualization and a lag screw followed by neutralization plate was placed.  We   then evaluated the overall alignment of the ankle.  Subsequently, proceeded to the medial side and made a medial incision and   carried this down through the skin and blunt dissection used down to the level   of the medial malleolus.   We identified the fracture site here, cleared it of   all debris, obtained a provisional anatomic reduction held with a clamp and a   wire and then placed a single 3.5 cortical lag screw with good compression.  We   subsequently irrigated this incision and then we proceeded to evaluation of the   syndesmosis.  There was still residual instability under fluoroscopy.  We   proceeded then to reduce the syndesmosis under direct and fluoroscopic visualization, held this with a clamp and then placed  the TightRope device through the lateral plate brought out through the medial   incision.  The button was placed down on bone.  We then tightened the TightRope   here.  We directly visualized the anterior capsular ligaments here and brought   this down to anatomic posture.  This was confirmed on orthogonal fluoroscopic   views.  We then introduced the arthroscope again and evaluated our syndesmotic   reduction and the ligament position, which appeared to be anatomic, and   subsequently cut the TightRope sutures.  The lateral incision was also copiously  irrigated with normal saline and both incisions closed in layers with 0 Vicryl,   2-0 Vicryl, interrupted nylon to close the skin.  Awakened from anesthesia and   transferred to the PACU in good condition.  Sponge and instrument counts were   correct, no complications.    POSTOPERATIVE PLAN:  The patient will be discharged home in the care of her   significant other.  Strict elevation, touchdown weightbearing.  VTE warning   signs have been discussed.  VTE prophylaxis with a daily aspirin by 6 weeks.    Follow up in clinic at 2 weeks and 6 weeks, and radiographs per protocol.    Christy Gentles, MD    DW/nlrowemt  D:  07/13/2019  2:39 P   T:  07/13/2019  7:28 P   TID:  [id number]  RECEIPT:    [id number]

## 2019-07-13 NOTE — Interval H&P Note (Signed)
I have assessed the patient.    No interval change in H&P.    Discussed possibility of external fixator based on appearance of soft tissues, but appear amenable to open surgery currently as visualized.     Hospital Outpatient Surgery Patient Class Confirmed.

## 2019-07-19 NOTE — Telephone Encounter (Signed)
Patient called and she will be out of her pain meds in the next day and would like a refill, please call patient

## 2019-07-21 MED ORDER — HYDROcodone-acetaminophen (NORCO) 5-325 mg per tablet
5-325 | ORAL_TABLET | Freq: Four times a day (QID) | ORAL | 0 refills | 30.00000 days | Status: AC | PRN
Start: 2019-07-21 — End: 2019-07-28

## 2019-07-21 MED ORDER — HYDROcodone-acetaminophen (NORCO) 5-325 mg per tablet
5-325 | ORAL_TABLET | Freq: Four times a day (QID) | ORAL | 0 refills | 30.00000 days | Status: DC | PRN
Start: 2019-07-21 — End: 2019-07-21

## 2019-07-21 NOTE — Telephone Encounter (Signed)
Medication sent to the pharmacy listed as preferred.    Cynthia Goodman

## 2019-07-21 NOTE — Addendum Note (Signed)
Addended by: Nanetta Batty on: 07/21/2019 12:02 PM     Modules accepted: Orders

## 2019-07-21 NOTE — Addendum Note (Signed)
Addended by: Andreas Newport on: 07/21/2019 12:00 PM     Modules accepted: Orders

## 2019-07-21 NOTE — Telephone Encounter (Signed)
Called and told the patient that her Rx refill request was ready for pick up at our front desk. She asked that it be sent in to her pharmacy to pick as they live near [place] and it is a 40 minute drive to our office. I told her  Would discuss this with Alycia Rossetti and have him send it over when we get a chance between patients. She was agreeable to this plan.     I have reviewed this with Alycia Rossetti and the new Rx was sent to her pharmacy in [place].

## 2019-07-27 ENCOUNTER — Ambulatory Visit: Admit: 2019-07-27 | Payer: PRIVATE HEALTH INSURANCE | Attending: PA-C

## 2019-07-27 DIAGNOSIS — Z8781 Personal history of (healed) traumatic fracture: Secondary | ICD-10-CM

## 2019-07-27 NOTE — Progress Notes (Signed)
Cynthia Goodman is a 28 y.o. female.    Chief Complaint   Patient presents with   . Post-Op Visit     2wk po R ORIF bimall, syndesmosis repair [facility] 07/13/19     Procedure Date:  07/13/2019    Procedure:    1.  Open reduction and internal fixation of right ankle.  2.  Open reduction internal fixation, distal tibiofibular joint disruption, right.  3.  Right ankle arthroscopy with limited debridement.    HISTORY OF PRESENT ILLNESS     Cynthia Goodman comes into the clinic today with a female companion with regards to her previous right ankle ORIF.  She states that she is doing well overall.  Her pain is well controlled.  She presents in her postoperative splint.  She indicates that she has been compliant with her activity restrictions and immobilization.    DVT prophylaxis: She has been taking 325 mg aspirin daily for DVT prophylaxis.    No fevers, chills, night sweats.  No incisional drainage.   No chest pain or shortness of breath.    REVIEW OF SYSTEMS   Review of Systems   Constitutional: Positive for activity change.   Gastrointestinal: Positive for constipation and nausea.   All other systems reviewed and are negative.    PHYSICAL EXAM     LMP 07/13/2019   There is no height or weight on file to calculate BMI.    No acute distress; alert and oriented and cooperative.    No clinical deformity noted; surgical incisions well healed.    Gait exam deferred.    Physical Exam  Constitutional:       General: She is not in acute distress.     Appearance: She is well-developed.   Musculoskeletal:      Comments:   -Surgical incisions are well-healed.  No active drainage.  No other significant clinical abnormality noted.  -She is relatively nontender to palpation today.  -She is able to gently dorsiflex, and plantar flex.  She can fire her EHL and wiggle all toes.  Resisted range of motion was deferred.  -The patient's distal neurovascular exam is grossly intact today.   Skin:     General: Skin is warm and dry.   Neurological:    Mental Status: She is alert.     Calf non-tender without cords.    ASSESSMENT        ICD-10-CM    1. Status post open reduction with internal fixation (ORIF) of fracture of ankle Z98.890     Z87.81    2. Closed fracture of right ankle, initial encounter S82.891A Referral to [facility]        PLAN        Sutures removed without incident.    Weightbearing: The patient will continue toe-touch weightbearing activity.    We will transition her from her postoperative splint to a cam walker boot.    VTE warning signs reviewed.  The patient will let us know if they experience any SOB or CP immediately and follow up in the ED if necessary.  The patient will continue their DVT prophylaxis as instructed.    A referral for [facility] has been provided on her behalf.  This will start after her standard 6-week postoperative visit.    X-rays were printed for her today.  She was also given information about a kneeled scooter.    The patient will follow-up with Dr. Percell Belt in approximately 4 weeks for standard  6-week postoperative visit.    Warning signs, symptoms, follow up, and return parameters were discussed as necessary.    The patient agrees with this plan.    All questions were solicited and answered and I would be happy to see Cynthia Goodman back at any time with further questions or concerns.       Andreas Newport, CMA    This note was dictated using voice recognition software. If you have any questions concerning the content, please call our office at [phone].

## 2019-07-28 NOTE — Telephone Encounter (Signed)
Patient called in stating that she is needing a medical release note to return. Patient is wanting to return to work tomorrow 07/29/2019.       Fax: [phone] ATTN:  Lourie      Please call back at   [phone]    Okay to leave a detailed message.

## 2019-07-28 NOTE — Telephone Encounter (Signed)
Patient called and would like to have a RTW letter done. She is a Development worker, international aid. Her employer is requesting ambulation needs being included in the work release form.     Patient requesting that we fax the letter to her HR. Cynthia Goodman [phone]

## 2019-07-29 NOTE — Telephone Encounter (Signed)
Called and let the patient know that her letter was written, she can get a copy of it via MyChart in the communications tab, I will put a copy in the mail and it was faxed to her HR as requested as well.

## 2019-07-29 NOTE — Telephone Encounter (Signed)
Work note was written with guidance from [place] and input from the patient on what was needed.

## 2019-07-30 NOTE — Telephone Encounter (Addendum)
Patient called in stating that the letter that was faxed to her HR department was not received.  Patient is asking that this letter be e-mailed to her work.  Patient is requesting this be done today 07/30/2019.      Please call back at [phone] if any questions     Okay to leave a detailed message.    E-mail  [email or web address]

## 2019-07-30 NOTE — Telephone Encounter (Signed)
Per Osvaldo Angst note below patient can access this letter via my chart. We are not able to email this due to HIPAA. I will re-fax this letter again for her now to the number listed. 07/30/2019 @ 4:27 pm

## 2019-08-03 NOTE — Telephone Encounter (Signed)
Pt called in and asked if we can modify her work note that is on My Chart to include the time she will need be performing light duty.  Please put in how many weeks of light duty and Pt also asked if we can mail that same work note to her home at [facility] [address]  [place] [address]

## 2019-08-04 NOTE — Telephone Encounter (Signed)
Letter was completed and mailed to patient today. 08/04/19.

## 2019-08-05 NOTE — Telephone Encounter (Signed)
Verified for Cynthia Goodman from [place] [employer] date of office visits and letter dates for light duty

## 2019-08-26 ENCOUNTER — Ambulatory Visit: Admit: 2019-08-26 | Payer: PRIVATE HEALTH INSURANCE | Attending: MD

## 2019-08-26 DIAGNOSIS — Z8781 Personal history of (healed) traumatic fracture: Secondary | ICD-10-CM

## 2019-08-26 NOTE — Progress Notes (Signed)
Cynthia Goodman is a 28 y.o. female.  Chief Complaint   Patient presents with   . Post-Op Visit     6 week post op right ankle     DATE OF PROCEDURE:  07/13/2019  ?  PROCEDURES PERFORMED:  1.  Open reduction and internal fixation of right ankle.  2.  Open reduction internal fixation, distal tibiofibular joint disruption,   right.  3.  Right ankle arthroscopy with limited debridement.    HISTORY OF PRESENT ILLNESS     Overall she is doing well.  She does have some occasional pain in the ankle.  She does feel it is stiff.  She gets a bit of aching at the end of the day.  She has been doing some range of motion exercises at home.  Symptoms occur anteriorly.  No distal numbness or tingling.  She did slip briefly and put some weight on the foot which she is a bit worried about.  Scheduled for physical therapy to begin in 5 days    Pain control: No medications needed  DVT prophylaxis: Completed a course of daily aspirin    No fevers, chills, night sweats.  No incisional drainage.   No chest pain or shortness of breath      No problems updated.  Past Medical History:   Diagnosis Date   . Fractures    . Right foot injury 1997    placed into a cast - no surgery     Past Surgical History:   Procedure Laterality Date   . ANKLE FRACTURE SURGERY     . PROCEDURE Right 07/13/2019    Procedure: OPEN REDUCTION INTERNAL FIXATION ANKLE;  Surgeon: Christy Gentles, MD;  Location: [facility] OR;  Service: Orthopedics;  Laterality: Right;   . PROCEDURE Right 07/13/2019    Procedure: OPEN REDUCTION INTERNAL FIXATION DISTAL TIBIOFIBULAR JOINT SYNDESMOSIS DISRUPTION;  Surgeon: Christy Gentles, MD;  Location: [facility] OR;  Service: Orthopedics;  Laterality: Right;   . PROCEDURE Right 07/13/2019    Procedure: ANKLE ARTHROSCOPY;  Surgeon: Christy Gentles, MD;  Location: [facility] OR;  Service: Orthopedics;  Laterality: Right;     Family History   Problem Relation Age of Onset   . Broken bones Brother    . Cancer Other      Allergies   Allergen Reactions   .  Sulfa (Sulfonamide Antibiotics) Other (See Comments)     irritation     Social History     Socioeconomic History   . Marital status: Single     Spouse name: Not on file   . Number of children: Not on file   . Years of education: Not on file   . Highest education level: Not on file   Tobacco Use   . Smoking status: Former Smoker     Years: 1.00     Quit date: 2013     Years since quitting: 7.6   . Smokeless tobacco: Never Used   Substance and Sexual Activity   . Alcohol use: Yes     Alcohol/week: 5.0 - 6.0 standard drinks     Types: 1 Glasses of wine, 4 - 5 Cans of beer per week   . Drug use: Yes     Frequency: 1.0 times per week     Types: Marijuana     Comment: Medical Cannibis   . Sexual activity: Yes     Partners: Male     Birth control/protection: I.U.D.     Comment: Copper IUD  Other Topics Concern   . Caffeine Concern Yes   . Exercise Yes       REVIEW OF SYSTEMS   Review of Systems   All other systems reviewed and are negative.           PHYSICAL EXAM   Temp 36.9 ?C (98.5 ?F) (Temporal)   Ht 5' 7 (1.702 m)   Wt 176 lb (79.8 kg)   BMI 27.57 kg/m?   Body mass index is 27.57 kg/m?Marland Kitchen    No acute distress; alert and oriented and cooperative    No clinical deformity noted; surgical incisions well healed; negative Tinels.  There is mild swelling.    Ankle range of motion 5 degrees dorsiflexion to 25 degrees plantarflexion.   Subtalar motion mildly restricted    Minimal fracture site tenderness    Calf non-tender without cords; Homan sign negative    Posterior tibialis pulse palpable; foot is warm and well perfused  Sensation is intact to light touch in the superficial peroneal, deep peroneal, sural, saphenous, and posterior tibial distributions.   Strength is 5/5 for dorsiflexion and plantarflexion, and extensor hallucis longus    IMAGING STUDY     Results for orders placed in visit on 08/26/19   XR ANKLE RIGHT AP LATERAL AND OBLIQUE 08/26/2019 08/26/2019    Narrative Plain radiographs of the right ankle.  Mortise  is overall symmetric and intact.  Fractures appear to be healing.  Hardware is stable.  Syndesmotic relationships are maintained         ASSESSMENT          Patient presents with   . Post-Op Visit     6 week post op right ankle         ICD-10-CM    1. Status post open reduction with internal fixation (ORIF) of fracture of ankle  Z98.890     Z87.81    2. Right ankle pain, unspecified chronicity  M25.571 X-ray ankle right AP lateral and oblique       Overall she is doing well.  She does have some stiffness as expected.       PLAN        Weightbearing may progress to as tolerated in the boot.  Around the house and with physical therapy she can even begin doing some weightbearing out of the boot if she is cautious.  I do think this will help her stiffness begin to improve    No further pharmacologic postoperative VTE prophylaxis indicated; VTE warning signs discussed    Begin physical therapy as scheduled.    Follow-up 4 weeks for repeat evaluation and weaning out of the boot and progressive physical therapy.    I do think this will take some time to improve, but I have every confidence she will continue to make good progress.  She had a fairly unstable and significant initial pattern.    Warning signs and symptoms and follow up and return parameters were discussed as necessary.    All questions were solicited and answered and I would be happy to see Cynthia Goodman back at any time with further questions or concerns.                 No follow-ups on file.

## 2019-08-26 NOTE — Progress Notes (Signed)
Review of Systems   All other systems reviewed and are negative.

## 2019-08-31 ENCOUNTER — Encounter: Payer: PRIVATE HEALTH INSURANCE | Attending: Student in an Organized Health Care Education/Training Program

## 2019-09-02 ENCOUNTER — Institutional Professional Consult (permissible substitution)
Admit: 2019-09-02 | Payer: PRIVATE HEALTH INSURANCE | Attending: Student in an Organized Health Care Education/Training Program

## 2019-09-02 DIAGNOSIS — S82891A Other fracture of right lower leg, initial encounter for closed fracture: Secondary | ICD-10-CM

## 2019-09-02 NOTE — Plan of Care (Signed)
[name]  [name]  [address]  [place], [place] [address]  Dept:  [phone]  Fax:  [phone]  Loc:  [phone]    Physical Therapy Evaluation and Plan of Care          Patient Name: Cynthia Goodman OZDGU'Y Date: 09/02/2019   Date of Birth: 03/06/1991 Referring Physician: Nanetta Batty, PA-C   Visits from [facility]:     PCP: NO FAMILY PHYSICIAN,     MRN: [id number]   Dates of Service: No data recorded -  09/02/2019  Gender:  female   Primary Referral Dx: S82.891A - Closed fracture of right ankle, initial encounter    Treatment Dx: No data recorded  Payor: [employer] / Plan: [employer] / Product Type: *No Product type* /      No data recorded     Start Time: 1345     Stop Time: 1425 Certified to (Recert or Discharge due): 12/01/19  Date Progress Report due: 10/02/19       PATIENT INTERVIEW    Reason for referral:  Cynthia Goodman is a 28 y.o. female who is referred to outpatient physical therapy for: Post operative rehab of her R ankle/foot complex. She fell off of her uni wheel on 07/06/2019 resulting in a B malleolar fx. While she is not in a lot of pain she does feel like she is very stiff.     Related surgeries/hospitalizations/therapy interventions include: DATE OF PROCEDURE: ?07/13/2019     PROCEDURES PERFORMED:  1. ?Open reduction and internal fixation of right ankle.  2. ?Open reduction internal fixation, distal tibiofibular joint disruption,   right.  3. ?Right ankle arthroscopy with limited debridement.      Cynthia [name] reports the following functional activity limitations associated with the reason for this referral -  Revised Patient Specific Functional Scale:  DESCRIPTION OF ACTIVITIES:  10=no difficulty   0=unable to perform PLOF  07/05/2019 09/02/2019        Walking and driving without aid 10 0   Stairs 10 4   Showering and bathing 10 2   Doing laundry 10 6   Standing for 1 hour doing household  activities 10  4     Total sum of scores 50/50 16/50   Average score 10 3.2   Average % function 100% 32%   Minimum detectable change (90% CI) for average score = 2 points  Minimum detectable change (90% CI) for single activity score = 3 points      Current Medical Problems:    Patient Active Problem List   Diagnosis SNOMED CT(R)   . Nondisplaced bimalleolar fracture of right lower leg BIMALLEOLAR FRACTURE OF ANKLE   . Sprain of right ankle SPRAIN OF RIGHT ANKLE       Past Surgical History:   Procedure Laterality Date   . ANKLE FRACTURE SURGERY     . PROCEDURE Right 07/13/2019    Procedure: OPEN REDUCTION INTERNAL FIXATION ANKLE;  Surgeon: Christy Gentles, MD;  Location: [facility] OR;  Service: Orthopedics;  Laterality: Right;   . PROCEDURE Right 07/13/2019    Procedure: OPEN REDUCTION INTERNAL FIXATION DISTAL TIBIOFIBULAR JOINT SYNDESMOSIS DISRUPTION;  Surgeon: Christy Gentles, MD;  Location: [facility] OR;  Service: Orthopedics;  Laterality: Right;   . PROCEDURE Right 07/13/2019    Procedure: ANKLE ARTHROSCOPY;  Surgeon: Christy Gentles, MD;  Location: [facility] OR;  Service: Orthopedics;  Laterality: Right;       Pain Level  Current  status: Mild (pain does not limit pt's ability to participate in therapy)        OBJECTIVE FINDINGS     LEFS: 34/80 = 43% Functional and 57% Impaired     Inspection: Incisions have healed, with no apparent signs of infection    AD: B crutches    Knee AROM = PROM:    Knee Flexion:  R: WNL    L: WNL  Knee Ext:        R: WNL    L: WNL    Ankle AROM:    Ankle DF:        R: 0 degrees (tested in weight bearing)   L: WNL (knee to wall with 1 foot 1 hand width away from wall)  Ankle PF:        R: 25 degrees  L: WNL  Ankle Inv:        R: 15 degrees   L: WNL  Ankle Ev:        R: 15 degrees   L: WNL    Lateral tibial glide was 0 degrees on R    1st MTP Extension: R: 10 degrees   L: WNL (need 70 degrees for normal gait)    LE Strength:    Hip Flexion:     R: 3+/5   L: 4/5  Hip Ext:           R: 3+/5   L: 4/5  Hip Abd:          R: 2+/5   L: 4/5    Knee Flexion: R: 3+/5   L: 4/5  Knee Ext:        R: 4-/5   L: 4/5    Ankle DF:        R: 3+/5   L: 4/5  Ankle Inv:        R: 3/5   L: 4/5  Ankle Ev:        R: 3/5   L: 4/5  Ankle PF Endurance:        R: 0 Heel raises   L: Not tested as she had to walk out using her L LE primarily so did not want to fatigue her at this time for safety with gait after evaluation     Special Tests:    Talocrural Joint Mobility: Hypomobile    Distal fibular head mobility: Hypomobile    Will test single limb balance amongst other things later in rehab process.    Today's treatment:  Evaluation today and instruction and performance in seated heel raise off of a small box, and knee extended ankle PF against purple resistance band with cues for slow eccentrics and when she had some slight heel pain instructed her to do 45 second isometric holds x 5 for home program.     ASSESSMENT     Rehab Potential: Good    Precautions: Per Dr. Venia Minks PN Weightbearing may progress to as tolerated in the boot.  Around the house and with physical therapy she can even begin doing some weightbearing out of the boot if she is cautious.  I do think this will help her stiffness begin to improve      Cynthia Goodman 's subjective and objective findings are consistent with the aforementioned diagnoses.  Patient's prior functional level was able to run with the Cynthia Goodman, walk, negotiate stairs, shower/bathe, fold laundry, stand for an hour and ride her uni wheel with no difficulty/pain.  Cynthia  Goodman presents with functional deficits in the aforementioned functional tasks.  The following impairments are limiting this patient's ability to perform functional activities and tasks that are meaningful to them and will be addressed through skilled physical therapy intervention.  ? Decreased ankle AROM/PROM  ? Decreased LE strength  ? Slight pain at rest that is worse with WB  ? Decreased function measured by  the PSFS and LEFS outcome measures.    Functional Therapy Goals                                        New or Continued Goals:      Outcomes:   Short Term (4 weeks):    1. 100% accuracy with home exercise program at end of each visit. 1.New    2. Patient will note ability to perform functional activity #1 from patient specific functional scale outcome measure with with at least a 40% increase in function. 2.New      3. Patient will note ability to perform functional activity #2 from patient specific functional scale outcome measure with with at least a 20% increase in function. 3.New        Long Term (12 weeks or by discharge):    1. 100% accuracy with home exercise program at end of each visit. 1.New    2. Patient will note ability to perform functional activity #3 from patient specific functional scale outcome measure with with at least a 70% increase in function. 2.New      3. Patient will feel confident in their ability to perform all functional tasks listed in the patient specific functional scale outcome measure with no fear and will have the ability to self manage their symptoms if activity does exacerbate their symptoms through skills gained in therapy.  3.New            Cynthia Goodman currently lacks the knowledge or ability to independently manage current symptoms and is at risk for continued impairments.  Therefore, skilled services of a licensed therapist are medically reasonable and necessary for the safe progression of exercise, neuromuscular re-ed and therapeutic activities in order to achieve Cynthia Goodman long term functional goal of being able to return back to running, performing ADLs and be able to use her uni wheel again.    Without physical therapy services, Cynthia Goodman is at risk for chronic ankle pain and mobility deficits that could lead to pain above and below her ankle. Cynthia Goodman co-morbidities/complexities that may impede/impact therapy:  None          Symptoms and general health characteristics:    Stable    Clinical presentation:     low complexity evaluation (stable, 0 co-morbidities impacting POC and 1-2 elements)    PLAN    Licensed Therapist determined ther ex combined with blood flow restriction (BFR) is indicated in this Cynthia Goodman treatment plan to facilitate the following:   BFR strength protocol to facilitate:  . Protein synthesis for increased muscle hypertrophy  . Mitigate muscle atrophy during non-WB phases  . Increased growth hormone production for faster healing rate vs. low load exercise alone   . Post BFR benefits of significant decrease in pain and increased exercise tolerance  BFR endurance to facilitate  . Improve VO2 max, exercise time to exhaustion, and muscle mass during low load aerobic training    Administration of BFR  requires the skill of a therapist to individualize tourniquet pressures, modify exercises, and tissue response to pressures for safety. Contraindications and precautions were reviewed with patient prior to initiation of BFR exercise.  Patient received education on benefit of BFR exercise and likelihood of delayed onset muscle soreness and potential bruising following use of BFR.      Treatment Interventions:   Gait Training 65784  Mobilization/Manual Therapy 97140  NM Re-Ed/Balance [id number]  Ther. Ex/Home Program [phone]  Therapeutic Activities 97530  ADL Training 52841  Aquatic Therapy 97113  Compression Therapy 97530  Core Stabilization Program 97110  Manual Lymph Drainage 97140  Postural Training/ Body Mechanics 97110    Expected Frequency/Duration of Therapy: 2x/week x 8 weeks followed by 1x/week x 4 weeks to begin at first treatment.    Archie Balboa, PT   09/02/2019    Untimed minutes: 30   Timed minutes: 10   TOTAL minutes: 40     $ PT Eval - Low Complexity (CPT 97161): Untimed single unit  $ Ther Exercises (CPT [phone]): 1 unit     _________________________________________________________    Thank you for  this referral.    Please sign below if you agree with the provided plan and return to the fax number listed above. Modifications/precautions are appreciated.     I certify the need for these services furnished under this plan of treatment and while under my care.    Provider Signature: ________________________________  Date: ___________________         Nanetta Batty, PA-C      (Patient Name: Iver Nestle)

## 2019-09-07 ENCOUNTER — Encounter: Payer: PRIVATE HEALTH INSURANCE | Attending: Student in an Organized Health Care Education/Training Program

## 2019-09-07 ENCOUNTER — Institutional Professional Consult (permissible substitution)
Admit: 2019-09-07 | Payer: PRIVATE HEALTH INSURANCE | Attending: Student in an Organized Health Care Education/Training Program

## 2019-09-07 NOTE — Treatment Summary (Signed)
PT Treatment Note    Patient: Cynthia Goodman Certified to (Recert or Discharge due): 12/01/19  Date Progress Report due: 10/02/19   Date of birth: 23-May-1991 Referring Physician: Nanetta Batty, PA-C   Primary Referral Dx: [phone] - Closed fracture of right ankle, initial encounter  PCP:  NO FAMILY PHYSICIAN,   Visits from [facility]:   (No data recorded)    Date: 09/07/2019 Time:  1540 - 1635      Related surgeries/hospitalizations/therapy interventions include: DATE OF PROCEDURE: ?07/13/2019   ?  PROCEDURES PERFORMED:  1. ?Open reduction and internal fixation of right ankle.  2. ?Open reduction internal fixation, distal tibiofibular joint disruption,   right.  3. ?Right ankle arthroscopy with limited debridement.  ?    Precautions: Per Dr. Venia Minks PN Weightbearing?may progress to as tolerated in the boot. ?Around the house and with physical therapy she can even begin doing some weightbearing out of the boot if she is cautious. ?I do think this will help her stiffness begin to improve    SUBJECTIVE:     Patient reports that she spent some time on her foot without the boot the other day and had subsequent swelling in her ankle and is wondering if this is normal or not. She explains that the swelling lasted 36 hours. She has been doing her exercises consistently.    Pain is mild in regards to its impact on function.     OBJECTIVE:       Treatment:      ? Manual Therapy: Grade IV talocrural DF mobilizations through the calcaneus was performed to decrease sensitization to the ankle to allow for increased ease of movement with transferring and walking and increased tissue tolerance during WB activities.    ? Therapeutic Exercise: Exercises provided below were performed with the intent to increase tissue load tolerance with walking/weight bearing activities with demonstration and verbal/tactile cues provided throughout for exercise performance.     Licensed Therapist determined ther ex combined with blood flow  restriction (BFR) is indicated in [name] to facilitate the following:   BFR strength pressures.  . Protein synthesis for increased muscle hypertrophy  . Mitigate muscle atrophy during non-WB phases  . Increased growth hormone production for faster healing rate vs. low load exercise alone   . Post BFR benefits of significant decrease in pain and increased exercise tolerance  BFR endurance pressures  . Improve VO2 max, exercise time to exhaustion, and muscle mass during low load aerobic training    Administration of BFR requires the skill of a therapist to individualize 80% occlusion pressures prior to each treatment due to daily variations in resting blood pressure.  Skilled PT will also modify exercises, and assess tissue response to pressures for safety. Contraindications and precautions are reviewed with patient prior to initiation of BFR exercise.  Patient received education on benefit of BFR exercise and likelihood of delayed onset muscle soreness and potential bruising following use of BFR.    20# KB seated heel raises with towel under toes to increase extension at top of movement x 90 reps    15 minutes of walking without boot with crutch in L hand and gait belt used CGA throughout for safety    Mini lunge with therapist blocking TC jt and cues to bring knee over the lateral aspect of her toes (showed her how to do this at home using a wooden dowel) x 90 reps          ASSESSMENT:  Patient's subjective/objective findings are consistent with the aforementioned diagnosis.  Patient appears motivated and is making progress towards goals listed in PT Evaluation.  At conclusion of today's therapy session, patient reported fatigue with new exercises. They demonstrated improved ankle DF with exercise and walking. They still need skilled therapy in order to work on improving load tolerance during WB tasks such as running and uni wheeling to achieve their therapy goals.     PLAN:      Patient will be progressed towards independence with Home Exercise Program and symptom management.  Plan to progress ankle DF and WB as tolerated using BFR.     Archie Balboa, PT   09/07/2019        Timed minutes: 55   TOTAL minutes: 55     $ Ther Exercises (CPT 97110): 3 units  $ Manual Therapy (CPT 97140): 1 unit     Therapist/License #: Archie Balboa, PT [phone]

## 2019-09-09 ENCOUNTER — Encounter: Payer: PRIVATE HEALTH INSURANCE | Attending: Student in an Organized Health Care Education/Training Program

## 2019-09-09 ENCOUNTER — Institutional Professional Consult (permissible substitution)
Admit: 2019-09-09 | Payer: PRIVATE HEALTH INSURANCE | Attending: Student in an Organized Health Care Education/Training Program

## 2019-09-09 NOTE — Treatment Summary (Signed)
PT Treatment Note    Patient: Cynthia Goodman Certified to (Recert or Discharge due): 12/01/19  Date Progress Report due: 10/02/19   Date of birth: 08-13-1991 Referring Physician: Nanetta Batty, PA-C   Primary Referral Dx: [phone] - Closed fracture of right ankle, initial encounter  PCP:  NO FAMILY PHYSICIAN,   Visits from [facility]:   (No data recorded)    Date: 09/09/2019 Time:  1345 - 1430      Related surgeries/hospitalizations/therapy interventions include: DATE OF PROCEDURE: ?07/13/2019   ?  PROCEDURES PERFORMED:  1. ?Open reduction and internal fixation of right ankle.  2. ?Open reduction internal fixation, distal tibiofibular joint disruption,   right.  3. ?Right ankle arthroscopy with limited debridement.  ?    Precautions: Per Dr. Venia Minks PN Weightbearing?may progress to as tolerated in the boot. ?Around the house and with physical therapy she can even begin doing some weightbearing out of the boot if she is cautious. ?I do think this will help her stiffness begin to improve    SUBJECTIVE:     Patient reports that she spent a good amount of time on her foot and has been practicing walking with 1 crutch at home. She feels like she is making good progress thus far in PT.     Pain is mild in regards to its impact on function.     OBJECTIVE:       Treatment:      ? Manual Therapy: None today     ? Aquatic Exercise: Exercises provided below were performed with the intent to increase tissue load tolerance with walking/weight bearing activities with instruction provided throughout for exercise performance.       Walking on underwater treadmill with visual feedback via underwater camera and cues for using all rockers x 15 minutes going F at 1.0 mph    Walking on underwater treadmill with visual feedback via underwater camera and cues for using all rockers x 10 minutes going B at 1.0 mph    Step ups 3 x 15 with cues to limit dynamic knee valgus    B heel raises 3 x 15    Bicycle kicks in corner x 5 minutes with  cues to scoop water back towards her     SBA and cues for stair negotiation using single crutch and railing to go up to and out of the pool area.             ASSESSMENT:   Patient's subjective/objective findings are consistent with the aforementioned diagnosis.  Patient appears motivated and is making progress towards goals listed in PT Evaluation.  At conclusion of today's therapy session, patient reported fatigue with new exercise. They demonstrated improved ankle DF with exercise and walking and understood how to negotiate stairs safely with 1 crutch. They still need skilled therapy in order to work on improving load tolerance during WB tasks such as running and uni wheeling to achieve their therapy goals.     PLAN:   Patient will be progressed towards independence with Home Exercise Program and symptom management.  Plan to progress ankle DF and WB as tolerated using BFR.     Archie Balboa, PT   09/09/2019        Timed minutes: 45   TOTAL minutes: 45     $ Aquatic Therapy Attended (CPT U009502): 3 units     Therapist/License #: Archie Balboa, PT [phone]

## 2019-09-13 ENCOUNTER — Institutional Professional Consult (permissible substitution)
Admit: 2019-09-13 | Payer: PRIVATE HEALTH INSURANCE | Attending: Student in an Organized Health Care Education/Training Program

## 2019-09-13 NOTE — Treatment Summary (Signed)
PT Treatment Note    Patient: Cynthia Goodman Certified to (Recert or Discharge due): 12/01/19  Date Progress Report due: 10/02/19   Date of birth: October 14, 1991 Referring Physician: Nanetta Batty, PA-C   Primary Referral Dx: [phone] - Closed fracture of right ankle, initial encounter  PCP:  NO FAMILY PHYSICIAN,   Visits from [facility]:   (No data recorded)    Date: 09/13/2019 Time:  1430 - 1515      Related surgeries/hospitalizations/therapy interventions include: DATE OF PROCEDURE: ?07/13/2019   ?  PROCEDURES PERFORMED:  1. ?Open reduction and internal fixation of right ankle.  2. ?Open reduction internal fixation, distal tibiofibular joint disruption,   right.  3. ?Right ankle arthroscopy with limited debridement.  ?    Precautions: Per Dr. Venia Minks PN Weightbearing?may progress to as tolerated in the boot. ?Around the house and with physical therapy she can even begin doing some weightbearing out of the boot if she is cautious. ?I do think this will help her stiffness begin to improve    SUBJECTIVE:     Patient reports that she spent a good amount of time on her foot and has been practicing walking with 1 crutch at home. She feels like she is making good progress thus far in PT.     Pain is mild in regards to its impact on function.     OBJECTIVE:       Treatment:      ? Manual Therapy: None today     ? Aquatic Exercise: Exercises provided below were performed with the intent to increase tissue load tolerance with walking/weight bearing activities with instruction provided throughout for exercise performance.       Walking on underwater treadmill with visual feedback via underwater camera and cues for using all rockers x 15 minutes going F at 1.5 mph    Walking on underwater treadmill with visual feedback via underwater camera and cues for using all rockers x 15 minutes going B at 1.5 mph    Step ups with hip and knee flexion 1 x 30 with cues to limit dynamic knee valgus    B heel raises 3 x 15    Bicycle kicks in  corner x 5 minutes with cues to scoop water back towards her     SBA and cues for stair negotiation using single crutch and railing to go up to and out of the pool area.             ASSESSMENT:   Patient's subjective/objective findings are consistent with the aforementioned diagnosis.  Patient appears motivated and is making progress towards goals listed in PT Evaluation.  At conclusion of today's therapy session, patient reported fatigue with progression of her aquatic therapy exercises. They demonstrated normal gait pattern in pool today and may tolerate progression of lowering pool height. They still need skilled therapy in order to work on improving load tolerance during WB tasks such as running and uni wheeling to achieve their therapy goals.     PLAN:   Patient will be progressed towards independence with Home Exercise Program and symptom management.  Plan to progress ankle DF and WB as tolerated using BFR.  Lower pool to stomach level and/or add jets next aquatic therapy session.      Archie Balboa, PT   09/13/2019        Timed minutes: 45   TOTAL minutes: 45     $ Aquatic Therapy Attended (CPT [phone]): 3 units     Therapist/License #:  Archie Balboa, PT [phone]

## 2019-09-14 NOTE — Telephone Encounter (Signed)
Leta Jungling [employer] called in to verify medical information.     He stated he will be faxing over a form for clinic to fill out and refax.     Claim number: [id number]    Called clinic no answer.     Please call back at [phone]. Okay to leave a detailed message.

## 2019-09-16 ENCOUNTER — Encounter: Payer: PRIVATE HEALTH INSURANCE | Attending: Student in an Organized Health Care Education/Training Program

## 2019-09-16 ENCOUNTER — Institutional Professional Consult (permissible substitution)
Admit: 2019-09-16 | Payer: PRIVATE HEALTH INSURANCE | Attending: Student in an Organized Health Care Education/Training Program

## 2019-09-16 NOTE — Treatment Summary (Signed)
PT Treatment Note    Patient: Cynthia Goodman Certified to (Recert or Discharge due): 12/01/19  Date Progress Report due: 10/02/19   Date of birth: February 01, 1991 Referring Physician: Nanetta Batty, PA-C   Primary Referral Dx: [phone] - Closed fracture of right ankle, initial encounter  PCP:  NO FAMILY PHYSICIAN,   Visits from [facility]:   (No data recorded)    Date: 09/16/2019 Time:  1530 - 1615      Related surgeries/hospitalizations/therapy interventions include: DATE OF PROCEDURE: ?07/13/2019   ?  PROCEDURES PERFORMED:  1. ?Open reduction and internal fixation of right ankle.  2. ?Open reduction internal fixation, distal tibiofibular joint disruption,   right.  3. ?Right ankle arthroscopy with limited debridement.  ?    Precautions: Per Dr. Venia Minks PN Weightbearing?may progress to as tolerated in the boot. ?Around the house and with physical therapy she can even begin doing some weightbearing out of the boot if she is cautious. ?I do think this will help her stiffness begin to improve    SUBJECTIVE:     [place] was careful when she was walking around and chose to be conservative when her family was climbing rocks (she watched from below).    Pain is mild in regards to its impact on function.     OBJECTIVE:       Treatment:      ? Manual Therapy: Grade IV talocrural DF mobilizations through the calcaneus was performed to decrease sensitization to the ankle to allow for increased ease of movement with transferring and walking and increased tissue tolerance during WB activities.    ? Therapeutic Exercise: Exercises provided below were performed with the intent to increase tissue load tolerance with walking/weight bearing activities with demonstration and verbal/tactile cues provided throughout for exercise performance.     Licensed Therapist determined ther ex combined with blood flow restriction (BFR) is indicated in [name] to  facilitate the following:   BFR strength pressures.  . Protein synthesis for increased muscle hypertrophy  . Mitigate muscle atrophy during non-WB phases  . Increased growth hormone production for faster healing rate vs. low load exercise alone   . Post BFR benefits of significant decrease in pain and increased exercise tolerance  BFR endurance pressures  . Improve VO2 max, exercise time to exhaustion, and muscle mass during low load aerobic training    Administration of BFR requires the skill of a therapist to individualize 80% occlusion pressures prior to each treatment due to daily variations in resting blood pressure.  Skilled PT will also modify exercises, and assess tissue response to pressures for safety. Contraindications and precautions are reviewed with patient prior to initiation of BFR exercise.  Patient received education on benefit of BFR exercise and likelihood of delayed onset muscle soreness and potential bruising following use of BFR.    20# KB seated heel raises with towel under toes to increase extension at top of movement x 90 reps    15 minutes of walking without boot with crutch in L hand and gait belt used CGA throughout for safety    Quadruped hip extension with straight knee x 90    Quadruped fire hydrant x 60           ASSESSMENT:   Patient's subjective/objective findings are consistent with the aforementioned diagnosis.  Patient appears motivated and is making progress towards goals listed in PT Evaluation.  At conclusion of today's  therapy session, patient reported fatigue with new exercises. They demonstrated improved ankle DF with exercise and walking. They still need skilled therapy in order to work on improving load tolerance during WB tasks such as running and uni wheeling to achieve their therapy goals.     PLAN:   Patient will be progressed towards independence with Home Exercise Program and symptom management.  Plan to progress ankle DF and WB as tolerated using BFR.     Archie Balboa, PT   09/16/2019        Timed minutes: 45   TOTAL minutes: 45     $ Ther Exercises (CPT 97110): 3 units     Therapist/License #: Archie Balboa, PT [phone]

## 2019-09-21 ENCOUNTER — Institutional Professional Consult (permissible substitution)
Admit: 2019-09-21 | Payer: PRIVATE HEALTH INSURANCE | Attending: Student in an Organized Health Care Education/Training Program

## 2019-09-21 NOTE — Treatment Summary (Signed)
PT Treatment Note    Patient: Cynthia Goodman Certified to (Recert or Discharge due): 12/01/19  Date Progress Report due: 10/02/19   Date of birth: [birth year] Referring Physician: Nanetta Batty, PA-C   Primary Referral Dx: [phone] - Closed fracture of right ankle, initial encounter  PCP:  NO FAMILY PHYSICIAN,   Visits from [facility]:   (No data recorded)    Date: 09/21/2019 Time:  0915 - 1000      Related surgeries/hospitalizations/therapy interventions include: DATE OF PROCEDURE: ?07/13/2019   ?  PROCEDURES PERFORMED:  1. ?Open reduction and internal fixation of right ankle.  2. ?Open reduction internal fixation, distal tibiofibular joint disruption,   right.  3. ?Right ankle arthroscopy with limited debridement.  ?    Precautions: Per Dr. Venia Minks PN Weightbearing?may progress to as tolerated in the boot. ?Around the house and with physical therapy she can even begin doing some weightbearing out of the boot if she is cautious. ?I do think this will help her stiffness begin to improve    SUBJECTIVE:     Patient would like to know when she can start wearing her boot with full WB at work, and if she is doing damage to her knee by using the knee scooter at work for efficiency.     Pain is mild in regards to its impact on function.     OBJECTIVE:       Treatment:      ? Manual Therapy: None today     ? Aquatic Exercise: Exercises provided below were performed with the intent to increase tissue load tolerance with walking/weight bearing activities with instruction provided throughout for exercise performance.       Walking on underwater treadmill with visual feedback via underwater camera and cues for using all rockers x 15 minutes going F at 1.7 mph with pool height just below sternum level    Walking on underwater treadmill with visual feedback via underwater camera and cues for using all rockers x 15 minutes going B at 1.2 mph with cues to put weight through big toe first and then rock back to heel with pool height  just below sternum level    Walking laterally on treadmill R/L for 6 minute each at 1.0 mph (no pain with this) with pool height just below sternum level     SBA and cues for stair negotiation using single crutch and railing to go up to and out of the pool area.      Eduction on pain neuroscience throughout treatment as she had questions about pain.            ASSESSMENT:   Patient's subjective/objective findings are consistent with the aforementioned diagnosis.  Patient appears motivated and is making progress towards goals listed in PT Evaluation.  At conclusion of today's therapy session, patient reported fatigue with progression of her aquatic therapy exercises. They demonstrated normal gait pattern in pool today and may tolerate progression of lowering pool height. They still need skilled therapy in order to work on improving load tolerance during WB tasks such as running and uni wheeling to achieve their therapy goals.     PLAN:   Patient will be progressed towards independence with Home Exercise Program and symptom management.  Plan to progress ankle DF and WB as tolerated using BFR.  Lower pool to stomach/belly button level and/or add jets next aquatic therapy session.      Archie Balboa, PT   09/21/2019  Timed minutes: 45   TOTAL minutes: 45     $ Aquatic Therapy Attended (CPT U009502): 3 units     Therapist/License #: Archie Balboa, PT [phone]

## 2019-09-23 ENCOUNTER — Institutional Professional Consult (permissible substitution)
Admit: 2019-09-23 | Payer: PRIVATE HEALTH INSURANCE | Attending: Student in an Organized Health Care Education/Training Program

## 2019-09-23 DIAGNOSIS — S82891A Other fracture of right lower leg, initial encounter for closed fracture: Secondary | ICD-10-CM

## 2019-09-23 NOTE — Treatment Summary (Signed)
PT Treatment Note    Patient: Cynthia Goodman Certified to (Recert or Discharge due): 12/01/19  Date Progress Report due: 10/02/19   Date of birth: 12-Jun-1991 Referring Physician: Nanetta Batty, PA-C   Primary Referral Dx: [phone] - Closed fracture of right ankle, initial encounter  PCP:  NO FAMILY PHYSICIAN,   Visits from [facility]:   (No data recorded)    Date: 09/23/2019 Time:  0830 - 0915      Related surgeries/hospitalizations/therapy interventions include: DATE OF PROCEDURE: ?07/13/2019   ?  PROCEDURES PERFORMED:  1. ?Open reduction and internal fixation of right ankle.  2. ?Open reduction internal fixation, distal tibiofibular joint disruption,   right.  3. ?Right ankle arthroscopy with limited debridement.  ?    Precautions: Per Dr. Venia Goodman PN Weightbearing?may progress to as tolerated in the boot. ?Around the house and with physical therapy she can even begin doing some weightbearing out of the boot if she is cautious. ?I do think this will help her stiffness begin to improve    SUBJECTIVE:     Patient reports that she has been consistent with her exercises and is happy with her progress to date.     Pain is mild in regards to its impact on function.     OBJECTIVE:       Treatment:      ? Manual Therapy: Grade IV talocrural DF mobilizations through the calcaneus was performed to decrease sensitization to the ankle to allow for increased ease of movement with transferring and walking and increased tissue tolerance during WB activities.    ? Therapeutic Exercise: Exercises provided below were performed with the intent to increase tissue load tolerance with walking/weight bearing activities with demonstration and verbal/tactile cues provided throughout for exercise performance.     Contract relax stretches going into increased lateral tibial glide and 1st mtp extension     Licensed Therapist determined ther ex combined with blood flow restriction (BFR) is indicated in [name]  to facilitate the following:   BFR strength pressures.  . Protein synthesis for increased muscle hypertrophy  . Mitigate muscle atrophy during non-WB phases  . Increased growth hormone production for faster healing rate vs. low load exercise alone   . Post BFR benefits of significant decrease in pain and increased exercise tolerance  BFR endurance pressures  . Improve VO2 max, exercise time to exhaustion, and muscle mass during low load aerobic training    Administration of BFR requires the skill of a therapist to individualize 80% occlusion pressures prior to each treatment due to daily variations in resting blood pressure.  Skilled PT will also modify exercises, and assess tissue response to pressures for safety. Contraindications and precautions are reviewed with patient prior to initiation of BFR exercise.  Patient received education on benefit of BFR exercise and likelihood of delayed onset muscle soreness and potential bruising following use of BFR.    5 x 15 reps of squats with cues throughout for fixing good morning squat and for reducing dynamic knee valgus.    15 minutes of walking without boot with no AD throughout with cues to allow for knee to go over foot vs. Inside of foot with subsequent pronation.            ASSESSMENT:   Patient's subjective/objective findings are consistent with the aforementioned diagnosis.  Patient appears motivated and is making progress towards goals listed in PT Evaluation.  At conclusion of today's therapy session, patient reported fatigue with new  exercises. They demonstrated improved ankle lateral tibial glide with exercise and walking. They still need skilled therapy in order to work on improving load tolerance during WB tasks such as running and uni wheeling to achieve their therapy goals.     PLAN:   Patient will be progressed towards independence with Home Exercise Program and symptom management.  Plan to progress ankle DF and WB as tolerated using BFR.     Cynthia Goodman, PT   09/23/2019        Timed minutes: 45   TOTAL minutes: 45     $ Ther Exercises (CPT 97110): 2 units  $ Manual Therapy (CPT 97140): 1 unit     Therapist/License #: Cynthia Goodman, PT [phone]

## 2019-09-27 ENCOUNTER — Encounter: Payer: PRIVATE HEALTH INSURANCE | Attending: Student in an Organized Health Care Education/Training Program

## 2019-09-30 ENCOUNTER — Institutional Professional Consult (permissible substitution)
Admit: 2019-09-30 | Payer: PRIVATE HEALTH INSURANCE | Attending: Student in an Organized Health Care Education/Training Program

## 2019-09-30 ENCOUNTER — Ambulatory Visit: Admit: 2019-09-30 | Discharge: 2019-10-05 | Payer: PRIVATE HEALTH INSURANCE | Attending: PA-C

## 2019-09-30 DIAGNOSIS — Z8781 Personal history of (healed) traumatic fracture: Secondary | ICD-10-CM

## 2019-09-30 NOTE — Progress Notes (Signed)
PT Progress Report     Patient: Amiayah Giebel Certified to (Recert or Discharge due): 12/01/19  Date Progress Report due: 10/02/19   Date of birth: December 21, 1991 Referring Physician: Nanetta Batty, PA-C   Primary Referral Dx: [phone] - Closed fracture of right ankle, initial encounter  PCP:  NO FAMILY PHYSICIAN,   Visits from [facility]   (No data recorded)    Date: 09/30/2019 Time:  1345 - 1430      Related surgeries/hospitalizations/therapy interventions include:?DATE OF PROCEDURE: ?07/13/2019   ?  PROCEDURES PERFORMED:  1. ?Open reduction and internal fixation of right ankle.  2. ?Open reduction internal fixation, distal tibiofibular joint disruption,   right.  3. ?Right ankle arthroscopy with limited debridement.  ?  ?  Precautions:?Per Dr. Venia Minks PN Weightbearing?may progress to as tolerated in the boot. ?Around the house and with physical therapy she can even begin doing some weightbearing out of the boot if she is cautious. ?I do think this will help her stiffness begin to improve  ?    SUBJECTIVE:   Patient reports doing exercises consistently at home and feels like she is making good progress. She has been practicing yoga with her friend who is an Secondary school teacher and feels like this has been very good for her ankle and LE strength.    Pain is moderate in regards to its impact on function.     Related surgeries/hospitalizations/therapy interventions include: DATE OF PROCEDURE: ?07/13/2019   ?  PROCEDURES PERFORMED:  1. ?Open reduction and internal fixation of right ankle.  2. ?Open reduction internal fixation, distal tibiofibular joint disruption,   right.  3. ?Right ankle arthroscopy with limited debridement.  ?  ?  Shawntrice Goelz's reports the following functional activity limitations associated with the reason for this referral -  Revised Patient Specific Functional Scale:  DESCRIPTION OF ACTIVITIES:  10=no difficulty   0=unable to perform PLOF  07/05/2019 09/02/2019  ?    09/30/2019    With a boot      Walking and driving without aid 10 0 0   Stairs 10 4 7    Showering and bathing 10 2 10    Doing laundry 10 6 10    Standing for 1 hour doing household activities 10  4  ? 10   Total sum of scores 50/50 16/50 37/50    Average score 10 3.2 7.4   Average % function 100% 32% 74%   Minimum detectable change (90% CI) for average score = 2 points  Minimum detectable change (90% CI) for single activity score = 3 points  ?  ?  Pain Level  Current status: Mild (pain does not limit pt's ability to participate in therapy)      ?  OBJECTIVE FINDINGS   ?  LEFS: 34/80 = 43% Functional and 57% Impaired 09/02/2019  ?  LEFS: 50/80 = 63% Functional and 37% Impaired 09/30/2019 without boot     ?  AD: Ankle boot  ?  Knee AROM = PROM:  ?  Knee Flexion:  R: WNL    L: WNL  Knee Ext:        R: WNL    L: WNL  ?  Ankle AROM:  ?  Ankle DF:        R: 16 cm from the wall (tested in weight bearing)   L: Knee to wall (knee to wall with 1 foot 1 hand width away from wall)  Ankle PF:        R: 40 degrees (was  25 degrees)  L: WNL  Ankle Inv:        R: 35 (was 15 degrees)   L: WNL  Ankle Ev:        R: 30 (was 15 degrees)   L: WNL  ?  Lateral tibial glide was 0 degrees on R  ?  1st MTP Extension: R: 60 (10 degrees)   L: WNL (need 70 degrees for normal gait)  ?  LE Strength:  ?  Hip Flexion:     R: 4-/5   L: 4/5  Hip Ext:           R: 3+/5   L: 4/5  Hip Abd:         R: 3/5   L: 4/5  ?  Knee Flexion: R: 3+/5   L: 4/5  Knee Ext:        R: 4-/5   L: 4/5  ?  Ankle DF:        R: 4-/5   L: 4/5  Ankle Inv:        R:4-/5   L: 4/5  Ankle Ev:        R: 4-/5   L: 4/5  Ankle PF Endurance:        R: 1 Heel raises   L: 30 reps   ?  Special Tests:  ?  Talocrural Joint Mobility: Hypomobile  ?  Distal fibular head mobility: Hypomobile  ?  ?  Treatment:  ?  ?  ? Manual Therapy: Grade IV talocrural DF mobilizations through the calcaneus was performed to decrease sensitization to the ankle to allow for increased ease of movement with transferring and walking and increased  tissue tolerance during WB activities.  ?  ? Therapeutic Exercise: Exercises provided below were performed with the intent to increase tissue load tolerance with walking/weight bearing activities with demonstration and verbal/tactile cues provided throughout for exercise performance.   ?  LE isometrics, Ankle AROM/PROM, heel raises B and outcome measure assessments    B heel raises x 30     B squat to heel raise with cues for form throughout x 15 followed by the same holding a 2kg ball in front of her x 15 reps    Taught and performed self ankle PF MWM x 10 reps on therapy table.   ?  ?  ASSESSMENT   ?  Emalene is making great progress in regards to her ankle AROM/PROM, LE strength and overall function measured by the LEFS and PSFS outcome measures. She does continue to require skilled therapy in order to return to sport/running as she continues to have difficulty when walking out of the boot with no AD at this time.  ?  Functional Therapy Goals                                        New or Continued Goals:      Outcomes:   Short Term (4 weeks): ?   1. 100% accuracy with home exercise program at end of each visit.   2. Patient will note ability to perform functional activity #1 from patient specific functional scale outcome measure with with at least a 40% increase in function. 2.Not met    3. Patient will note ability to perform functional activity #2 from patient specific functional scale outcome measure with with at least a 20% increase in  function. 3.Met     ?  Long Term (12 weeks or by discharge): ?   1. 100% accuracy with home exercise program at end of each visit. 1.Progress made   2. Patient will note ability to perform functional activity #3 from patient specific functional scale outcome measure with with at least a 70% increase in function. 2.Met    3. Patient will feel confident in their ability to perform all functional tasks listed in the patient specific functional scale outcome measure with no  fear and will have the ability to self manage their symptoms if activity does exacerbate their symptoms through skills gained in therapy.  3.Progress made           Archie Balboa, PT   09/30/2019        Timed minutes: 45   TOTAL minutes: 45     $ Ther Exercises (CPT 97110): 2 units  $ Manual Therapy (CPT 97140): 1 unit     Therapist/License #: Archie Balboa, PT [phone]

## 2019-09-30 NOTE — Progress Notes (Signed)
Cynthia Goodman is a 28 y.o. female.     Chief Complaint   Patient presents with   . Follow-up     10 week post op right ankle ORIF     HISTORY OF PRESENT ILLNESS     Cynthia Goodman is a 28 y.o. female here today for further evaluation of her right ankle ORIF procedure. Date of surgery was 07/13/2019. Patient is currently in PT and states this has been very helpful to her. She discontinued the use of an assisted walking device with in the last week. Her pain does spike to an achy 6/10 by the end of the day but is intermittent. She applies ice as needed for pain and swelling.       Past Medical History:   Diagnosis Date   . Fractures    . Right foot injury 1997    placed into a cast - no surgery       Family History   Problem Relation Age of Onset   . Broken bones Brother    . Cancer Other        Past Surgical History:   Procedure Laterality Date   . ANKLE FRACTURE SURGERY     . PROCEDURE Right 07/13/2019    Procedure: OPEN REDUCTION INTERNAL FIXATION ANKLE;  Surgeon: Christy Gentles, MD;  Location: [facility] OR;  Service: Orthopedics;  Laterality: Right;   . PROCEDURE Right 07/13/2019    Procedure: OPEN REDUCTION INTERNAL FIXATION DISTAL TIBIOFIBULAR JOINT SYNDESMOSIS DISRUPTION;  Surgeon: Christy Gentles, MD;  Location: [facility] OR;  Service: Orthopedics;  Laterality: Right;   . PROCEDURE Right 07/13/2019    Procedure: ANKLE ARTHROSCOPY;  Surgeon: Christy Gentles, MD;  Location: [facility] OR;  Service: Orthopedics;  Laterality: Right;       Allergies   Allergen Reactions   . Sulfa (Sulfonamide Antibiotics) Other (See Comments)     irritation       Social History     Socioeconomic History   . Marital status: Single     Spouse name: Not on file   . Number of children: Not on file   . Years of education: Not on file   . Highest education level: Not on file   Tobacco Use   . Smoking status: Former Smoker     Years: 1.00     Quit date: 2013     Years since quitting: 7.7   . Smokeless tobacco: Never Used   Substance and Sexual  Activity   . Alcohol use: Yes     Alcohol/week: 5.0 - 6.0 standard drinks     Types: 1 Glasses of wine, 4 - 5 Cans of beer per week   . Drug use: Yes     Frequency: 1.0 times per week     Types: Marijuana     Comment: Medical Cannibis   . Sexual activity: Yes     Partners: Male     Birth control/protection: I.U.D.     Comment: Copper IUD   Other Topics Concern   . Caffeine Concern Yes   . Exercise Yes       REVIEW OF SYSTEMS     Review of Systems   Constitutional: Positive for activity change.   Musculoskeletal: Positive for gait problem and joint swelling.   Psychiatric/Behavioral: Positive for confusion.   All other systems reviewed and are negative.     10 point review of systems negative except as noted above.    PHYSICAL EXAM     Resp  20   Ht 5' 7 (1.702 m)   Wt 176 lb (79.8 kg)   BMI 27.57 kg/m?   Body mass index is 27.57 kg/m?Marland Kitchen    Physical Exam  Vitals signs reviewed.   Constitutional:       General: She is not in acute distress.     Appearance: She is well-developed.   Musculoskeletal:      Comments:   -Surgical incisions are well-healed.  Mild edema noted.  -She is relatively nontender to palpation today about the right ankle.  -She is able to dorsiflex, plantar flex, and fire her EHL.  She is able to circumduct as well.  Resisted motion was deferred.  -The patient's distal neurovascular exam is grossly intact today.   Skin:     General: Skin is warm and dry.   Neurological:      Mental Status: She is alert.       ASSESSMENT        SNOMED CT(R)    1. Status post open reduction with internal fixation (ORIF) of fracture of ankle  H/O: FRACTURE         PLAN         The patient continues to improve.  I would like her to wean herself out of the cam walker boot.  She can start to utilize a regular shoe.  We discussed that if she had a significant increase in pain I would like her to go back into the boot for a day or 2 and then attempt to come back out of the boot.  She will continue to do this until she is  able to completely wean herself out of the boot.  I would like to see her back in the clinic in approximately 4 to 6 weeks for further evaluation.  If she continues to improve I would plan to release her to an as-needed basis at that time.  The patient agrees with this plan.    All questions were solicited and answered.  I would be happy to see Cynthia Goodman back at any time with further questions or concerns.       Nanetta Batty, PA-C    *This note was dictated using voice recognition software. If you have any questions concerning the content, please call our office at [phone].*

## 2019-10-06 ENCOUNTER — Institutional Professional Consult (permissible substitution): Admit: 2019-10-06 | Payer: PRIVATE HEALTH INSURANCE

## 2019-10-06 NOTE — Treatment Summary (Signed)
PT Treatment Note    Patient Name: Cynthia Goodman JSEGB'T Date: 10/06/2019   Date of Birth: 07-31-1991 Referring Physician: Nanetta Batty, PA-C   Visits from [facility]:  9  PCP: NO FAMILY PHYSICIAN,   Current Auth visit #: [id number] MRN: [id number]   Dates of Service: No data recorded -  10/06/2019  Gender:  female   Primary Referral Dx: S82.891A - Closed fracture of right ankle, initial encounter    Treatment Dx: No data recorded  Payor: [employer] / Plan: [employer] / Product Type: *No Product type* /      No data recorded     Start Time: 1245     Stop Time: 1330 Certified to (Recert or Discharge due): 12/01/19  Date Progress Report due: 10/02/19   Precautions:?Per Dr. Venia Minks PN Weightbearing?may progress to as tolerated in the boot. ?Around the house and with physical therapy she can even begin doing some weightbearing out of the boot if she is cautious. ?I do think this will help her stiffness begin to improve  ?  Functional Therapy Goals  ????????????????????????????????????  New or Continued Goals: ???? Outcomes:   Short Term?(4 weeks): ?   1. 100% accuracy with home exercise program at end of each visit.   2. Patient will note ability to perform functional activity #1 from patient specific functional scale outcome measure with with at least a?40% increase in function. 2.Not met    3. Patient will note ability to perform functional activity #2 from patient specific functional scale outcome measure with with at least a?20% increase in function. 3.Met??   ?  Long Term?(12?weeks or by discharge): ?   1. 100% accuracy with home exercise program at end of each visit. 1.Progress made   2. Patient will note ability to perform functional activity #3 from patient specific functional scale outcome measure with with at least a?70% increase in function. 2.Met?   3. Patient will feel confident in their ability to perform all functional tasks listed in the patient specific functional scale outcome  measure with no fear and will have the ability to self manage their symptoms if activity does exacerbate their symptoms through skills gained in therapy.         SUBJECTIVE  Pt sates that she has been going longer without wearing the  Boot and she slept without the boot on for the first night last night with no issues. Ankle tends to get tiff when she doesn't move it.    Pain Level  No pain    OBJECTIVE  Exercises provided below were performed with the intent to increase tissue load tolerance with demonstration and verbal/tactile cues provided throughout for exercise performance.  Warm Up:   Walking on Treadmill without boot Incline: Grade 5   Speed 1.0 mph x 2 min   Speed 1.5 mph x 5 min   Speed 2.61mph x 3 min  Noted decreased heel strike and toe roll off, B knee valgus needs vc to correct.     Toe scrunches 2 x 15   Squats with B heel raises gtb around knees for tc to prevent knee valgus 2  X 10   Split squats with RLE foot on plyo step to increase DF vc on keeping Heel planted on step 2x12    Manual therapy techniques were performed in order to reduce regional mm tightness, and to improve tissue circulation and mobility.   Lateral subtalar mob 2x1 min  distal tib fib mob with  passive IV stretching (use thigh to put ankle in INV) within pain free ROM 2x59min  AP      ASSESSMENT  Patient's subjective/objective findings are consistent with the aforementioned diagnosis.  Patient appears motivated and is making progress towards goals listed in PT Evaluation.  At conclusion of today's therapy session, patient reported confidence and fatigue with new exercises, she demonstrated improved ROM with DF but has difficulty avoiding knee valgus.    PLAN   Patient will be progressed towards independence with Home Exercise Program and symptom management.    Tawanna Solo, PTA   10/06/2019        Timed minutes: 45   TOTAL minutes: 45     $ Ther Exercises (CPT 97110): 2 units  $ Manual Therapy (CPT 97140): 1 unit

## 2019-10-14 ENCOUNTER — Institutional Professional Consult (permissible substitution)
Admit: 2019-10-14 | Payer: PRIVATE HEALTH INSURANCE | Attending: Student in an Organized Health Care Education/Training Program

## 2019-10-14 NOTE — Treatment Summary (Signed)
PT Treatment Note    Patient: Cynthia Goodman Certified to (Recert or Discharge due): 12/01/19  Date Progress Report due: 10/02/19   Date of birth: 04-29-1991 Referring Physician: Nanetta Batty, PA-C   Primary Referral Dx: [phone] - Closed fracture of right ankle, initial encounter  PCP:  NO FAMILY PHYSICIAN,   Visits from [facility]:   (No data recorded)    Date: 10/14/2019 Time:  1345 - 1430      Related surgeries/hospitalizations/therapy interventions include: DATE OF PROCEDURE: ?07/13/2019   ?  PROCEDURES PERFORMED:  1. ?Open reduction and internal fixation of right ankle.  2. ?Open reduction internal fixation, distal tibiofibular joint disruption,   right.  3. ?Right ankle arthroscopy with limited debridement.  ?    Precautions: Per Dr. Venia Minks PN Weightbearing?may progress to as tolerated in the boot. ?Around the house and with physical therapy she can even begin doing some weightbearing out of the boot if she is cautious. ?I do think this will help her stiffness begin to improve    SUBJECTIVE:     Patient notes that she has been in a normal shoe for a couple of days and explains that her foot feels sore. She has been practicing driving to work. She explains that it is harder to switch pedals, but she can do it.     Pain is mild in regards to its impact on function.     OBJECTIVE:       Treatment:      ? Manual Therapy: Grade IV talocrural DF mobilizations through the calcaneus was performed to decrease sensitization to the ankle to allow for increased ease of movement with transferring and walking and increased tissue tolerance during WB activities.  ?  ? Therapeutic Exercise: Exercises provided below were performed with the intent to increase tissue load tolerance with walking/weight bearing activities with demonstration and verbal/tactile cues provided throughout for exercise performance.?    Elasticon calcaneal hug tape to decrease pain in ankle/foot complex during exercises.   ?  Warm Up:     Walking on  Treadmill without boot:     Speed 1.5 mph x 3 min grade 0   Speed 1.5 mph x 3 min grade 5   Speed 2.0 mph x 3 min grade 5   Speed 2.5 mph x 11 minutes grade 5       Contract relax stretches going into increased lateral tibial glide.    ?     ASSESSMENT:   Patient's subjective/objective findings are consistent with the aforementioned diagnosis.  Patient appears motivated and is making progress towards goals listed in PT Evaluation.  At conclusion of today's therapy session, patient reported fatigue with progression of her treadmill training and had decreased pain with walking after manual therapy and taping. They still need skilled therapy in order to work on improving load tolerance during WB tasks such as running and uni wheeling to achieve their therapy goals.     PLAN:   Patient will be progressed towards independence with Home Exercise Program and symptom management.  Plan to progress ankle DF and WB as tolerated using BFR.  Lower pool to stomach/belly button level and/or add jets next aquatic therapy session.      Archie Balboa, PT   10/14/2019        Timed minutes: 45   TOTAL minutes: 45     $ Ther Exercises (CPT 97110): 2 units  $ Manual Therapy (CPT 97140): 1 unit     Therapist/License #: Earna Coder  Hanna Aultman, PT [phone]

## 2019-10-19 ENCOUNTER — Institutional Professional Consult (permissible substitution)
Admit: 2019-10-19 | Payer: PRIVATE HEALTH INSURANCE | Attending: Student in an Organized Health Care Education/Training Program

## 2019-10-19 NOTE — Treatment Summary (Signed)
PT Treatment Note    Patient: Cynthia Goodman Certified to (Recert or Discharge due): 12/01/19  Date Progress Report due: 10/02/19   Date of birth: 01-28-1991 Referring Physician: Nanetta Batty, PA-C   Primary Referral Dx: [phone] - Closed fracture of right ankle, initial encounter  PCP:  NO FAMILY PHYSICIAN,   Visits from [facility]:   (No data recorded)    Date: 10/19/2019 Time:  1645 - 1732      Related surgeries/hospitalizations/therapy interventions include: DATE OF PROCEDURE: ?07/13/2019   ?  PROCEDURES PERFORMED:  1. ?Open reduction and internal fixation of right ankle.  2. ?Open reduction internal fixation, distal tibiofibular joint disruption,   right.  3. ?Right ankle arthroscopy with limited debridement.  ?    Precautions: Per Dr. Venia Minks PN Weightbearing?may progress to as tolerated in the boot. ?Around the house and with physical therapy she can even begin doing some weightbearing out of the boot if she is cautious. ?I do think this will help her stiffness begin to improve    SUBJECTIVE:     Patient notes that she drove to PT and has been not wearing her boot the past few days. She has had some swelling, but feels like this is normal. She did yoga the other day and outside of going to her end ranges of DF and PF she felt great.     Pain is mild in regards to its impact on function.     OBJECTIVE:       Treatment:      ? Manual Therapy: Grade IV talocrural DF mobilizations through the calcaneus was performed to decrease sensitization to the ankle to allow for increased ease of movement with transferring and walking and increased tissue tolerance during WB activities.  ?  ? Therapeutic Exercise: Exercises provided below were performed with the intent to increase tissue load tolerance with walking/weight bearing activities with demonstration and verbal/tactile cues provided throughout for exercise performance.?    Licensed Therapist determined ther ex combined with blood flow restriction (BFR) is  indicated in [name] to facilitate the following:   BFR strength pressures.  . Protein synthesis for increased muscle hypertrophy  . Mitigate muscle atrophy during non-WB phases  . Increased growth hormone production for faster healing rate vs. low load exercise alone   . Post BFR benefits of significant decrease in pain and increased exercise tolerance  BFR endurance pressures  . Improve VO2 max, exercise time to exhaustion, and muscle mass during low load aerobic training    Administration of BFR requires the skill of a therapist to individualize 80% occlusion pressures prior to each treatment due to daily variations in resting blood pressure.  Skilled PT will also modify exercises, and assess tissue response to pressures for safety. Contraindications and precautions are reviewed with patient prior to initiation of BFR exercise.  Patient received education on benefit of BFR exercise and likelihood of delayed onset muscle soreness and potential bruising following use of BFR.       Ascending and descending 12 flights of stairs in a reciprocal fashoin with BFR  was performed in order to allow patient to be more functionally independent with stair negotation, requiring SB assist throughout training today.    Walking on Treadmill without boot:     Speed 1.5 mph x 5 min grade 4   Speed 1.5 mph x 4 min grade 5   Speed 1.5 mph x 3 min grade 7   Speed 1.5 mph x 3  minutes grade 9    ?     ASSESSMENT:   Patient's subjective/objective findings are consistent with the aforementioned diagnosis.  Patient appears motivated and is making progress towards goals listed in PT Evaluation.  At conclusion of today's therapy session, patient reported fatigue with progression of her treadmill training and had decreased pain with walking and stair negotatoin. They still need skilled therapy in order to work on improving load tolerance during WB tasks such as running and uni wheeling to achieve their  therapy goals.     PLAN:   Patient will be progressed towards independence with Home Exercise Program and symptom management.  Plan to progress ankle DF and WB as tolerated using BFR.  Lower pool to stomach/belly button level and/or add jets next aquatic therapy session.   Tall kneeling with BFR to increase tolerance to PF.      Archie Balboa, PT   10/19/2019        Timed minutes: 47   TOTAL minutes: 47     $ Ther Exercises (CPT 97110): 2 units  $ Manual Therapy (CPT 97140): 1 unit     Therapist/License #: Archie Balboa, PT [phone]

## 2019-11-03 ENCOUNTER — Ambulatory Visit: Admit: 2019-11-03 | Payer: PRIVATE HEALTH INSURANCE | Attending: PA-C

## 2019-11-03 DIAGNOSIS — Z8781 Personal history of (healed) traumatic fracture: Secondary | ICD-10-CM

## 2019-11-03 NOTE — Progress Notes (Signed)
Cynthia Goodman is a 28 y.o. female.    Chief Complaint   Patient presents with   . Post-Op Visit     16 week R ankle DOS 07/13/2019     DATE OF PROCEDURE: ?07/13/2019  ?  PROCEDURES PERFORMED:  1. ?Open reduction and internal fixation of right ankle.  2. ?Open reduction internal fixation, distal tibiofibular joint disruption,   right.  3. ?Right ankle arthroscopy with limited debridement.    HISTORY OF PRESENT ILLNESS     Cynthia Goodman is a 28 y.o. female here today for post operative evaluation of her right ankle s/p ORIF. Date of surgery was 07/13/2019. Patient is currently in PT and stating improvement. Her pain today is a 3/10. Although she feels the area is mostly improved, she did have a recent 10/10 painful day. She did not go back into cam boot as directed at last visit.  Patient is not taking any medications for pain management.     No fevers, chills, night sweats.  No incisional drainage.   No chest pain or shortness of breath.    REVIEW OF SYSTEMS   Review of Systems   Musculoskeletal: Positive for gait problem and joint swelling.   Skin: Positive for color change.   All other systems reviewed and are negative.     PHYSICAL EXAM     Resp 18   Ht 5' 7 (1.702 m)   Wt 176 lb (79.8 kg)   BMI 27.57 kg/m?   Body mass index is 27.57 kg/m?Marland Kitchen    No acute distress; alert and oriented and cooperative    No clinical deformity noted; surgical incisions well healed.    Gait exam: Nonantalgic.    Physical Exam  Vitals signs reviewed.   Musculoskeletal:      Right ankle: She exhibits decreased range of motion and swelling. She exhibits no ecchymosis, no deformity, no laceration and normal pulse. Tenderness. Lateral malleolus and medial malleolus tenderness found. No proximal fibula tenderness found. Achilles tendon exhibits no pain.      Comments:   -Swelling noted about the right ankle.  Primarily laterally.  -The majority of her tenderness is at the distal medial malleolus.  She has mild tenderness just  distal to the lateral malleolus.  -She is able to dorsiflex and plantar flex.  She has limitations with regards to circumduction.  She is able to fire her EHL and wiggle all toes.  -DP and PT pulses are palpable.  Capillary refill is <2 seconds.  SILT in the superficial peroneal, deep peroneal, sural, saphenous, and posterior tibial distributions.         ASSESSMENT        ICD-10-CM    1. Status post open reduction with internal fixation (ORIF) of fracture of ankle  Z98.890 [place] [facility]    Z87.81         PLAN        Weightbearing: I would like the patient to continue weightbearing as tolerated activity.  She can utilize a lace up ankle brace for support if needed.    She indicated that she would like to continue working with physical therapy on range of motion and strengthening of her right ankle.  She feels like she has not strong enough to a point that she could take off running.  She would like a little bit more reassurance that her ankle is stable enough for her to do the things that she would like to do.  This seems reasonable.  A referral to physical therapy has been sent today.    I plan to release her to an as-needed basis.  If she were to have an increase in pain or discomfort I would like to see her back in the clinic.  Otherwise, I am happy to see her with any further orthopedic needs that she may have.    Warning signs and symptoms and follow up and return parameters were discussed as necessary.    The patient agrees with this plan.    All questions were solicited and answered and I would be happy to see Cynthia Goodman back at any time with further questions or concerns.       Nanetta Batty, PA-C    This note was dictated using voice recognition software. If you have any questions concerning the content, please call our office at [phone].

## 2019-11-11 ENCOUNTER — Institutional Professional Consult (permissible substitution)
Admit: 2019-11-12 | Payer: PRIVATE HEALTH INSURANCE | Attending: Student in an Organized Health Care Education/Training Program

## 2019-11-11 DIAGNOSIS — S82891A Other fracture of right lower leg, initial encounter for closed fracture: Secondary | ICD-10-CM

## 2019-11-11 NOTE — Progress Notes (Signed)
Opened in error.    Mahkai Fangman, PT

## 2019-11-11 NOTE — Other (Signed)
[facility] [facility]  [place] [facility]  [address]  [place], [place] [address]  Dept:  [phone]  Fax:  [phone]  Loc:  [phone]    Physical Therapy Recertification    Patient Name: Cynthia Goodman [id number] Date: 11/11/2019   Date of Birth: 02-24-1991 Referring Physician: Nanetta Batty, PA-C   Visits from [place] [facility]:     PCP: NO FAMILY PHYSICIAN,     MRN: [id number]   Dates of Service: No data recorded -  11/11/2019  Gender:  female   Primary Referral Dx: S82.891A - Closed fracture of right ankle, initial encounter    Treatment Dx: No data recorded  Payor: [employer] / Plan: [employer] / Product Type: *No Product type* /      No data recorded     Start Time: 1530     Stop Time: 1630 Certified to (Recert or Discharge due): 12/01/19  Date Progress Report due: 12/11/19       Related surgeries/hospitalizations/therapy interventions include:?DATE OF PROCEDURE: ?07/13/2019   ?  PROCEDURES PERFORMED:  1. ?Open reduction and internal fixation of right ankle.  2. ?Open reduction internal fixation, distal tibiofibular joint disruption,   right.  3. ?Right ankle arthroscopy with limited debridement.      SUBJECTIVE:   Patient reports going down stairs is getting easier, but there is some popping in the front/lateral aspect of her foot with this activity. She tried running but it doesn't look pretty. She explains her running looks like an out of control gummy bear. When asked if she has the most amount of difficulty with accepting the weight when running or pushing off she explains the most difficult part of running is weight acceptance. She explains that she had one day where she had significant pain come out of no where, but today her pain is manageable. She has been doing yoga 3x/week and rehab exercises about everyday.      Pain is moderate in regards to its impact on function.     Related surgeries/hospitalizations/therapy interventions include:  DATE OF PROCEDURE: ?07/13/2019   ?  PROCEDURES PERFORMED:  1. ?Open reduction and internal fixation of right ankle.  2. ?Open reduction internal fixation, distal tibiofibular joint disruption,   right.  3. ?Right ankle arthroscopy with limited debridement.  ?  ?  Cynthia [name] reports the following functional activity limitations associated with the reason for this referral -  Revised Patient Specific Functional Scale:  DESCRIPTION OF ACTIVITIES:  10=no difficulty   0=unable to perform PLOF  07/05/2019 09/02/2019  ?    09/30/2019    With a boot 11/11/2019    Without a boot   Walking and driving without aid 10 0 0 5   Stairs 10 4 7 7    Showering and bathing 10 2 10 10    Doing laundry 10 6 10 9    Standing for 1 hour doing household activities 10  4  ? 10 9   Total sum of scores 50/50 16/50 37/50  40/50   Average score 10 3.2 7.4 8   Average % function 100% 32% 74% 80%   Minimum detectable change (90% CI) for average score = 2 points  Minimum detectable change (90% CI) for single activity score = 3 points  ?  ?  Pain Level  Current status: Mild (pain does not limit pt's ability to participate in therapy)      ?  OBJECTIVE FINDINGS   ?  LEFS: 34/80 = 43%  Functional and 57% Impaired 09/02/2019  ?  LEFS: 50/80 = 63% Functional and 37% Impaired 09/30/2019 without boot     LEFS: 66/80 = 83% Functional and 17% Impaired 11/11/2019 without boot     ?  AD: Ankle boot  ?  Knee AROM = PROM:  ?  Knee Flexion:  R: WNL    L: WNL  Knee Ext:        R: WNL    L: WNL  ?  Ankle AROM:  ?  Ankle DF:        R: 8 cm from the wall in WB (was 16 cm from the wall tested in weight bearing))   L: Knee to wall (knee to wall with 1 foot 1 hand width away from wall)  Ankle PF:        R: WNL (was 25 degrees)  L: WNL  Ankle Inv:        R: WNL (was 15 degrees)   L: WNL  Ankle Ev:        R: WNL (was 15 degrees)   L: WNL  ?  Lateral tibial glide was WNL degrees on R   ?  1st MTP Extension: R: 70 (10 degrees)   L: WNL (need 70 degrees for normal  gait)  ?  LE Strength:  ?  Hip Flexion:     R: 4-/5   L: 4/5  Hip Ext:           R: 4-/5   L: 4/5  Hip Abd:         R: 3+/5   L: 4/5  ?  Knee Flexion: R: 4-/5   L: 4/5  Knee Ext:        R: 4-/5   L: 4/5  ?  Ankle DF:        R: 4-/5   L: 4/5  Ankle Inv:        R:4-/5   L: 4/5  Ankle Ev:        R: 4-/5   L: 4/5  Ankle PF Endurance:        R: 19 reps (1 Heel raises last session)  L: 30 reps   ?    Treatment:  ?  ?  ? Manual Therapy: Grade IV talocrural DF mobilizations through the calcaneus was performed to decrease sensitization to the ankle to allow for increased ease of movement with transferring and walking and increased tissue tolerance during WB activities.  ?  ? Therapeutic Exercise: Exercises provided below were performed with the intent to increase tissue load tolerance with walking/weight bearing activities with demonstration and verbal/tactile cues provided throughout for exercise performance.   ?  LE isometrics, Ankle AROM/PROM, heel raises B and outcome measure assessments    Licensed Therapist determined ther ex combined with blood flow restriction (BFR) is indicated in [name] to facilitate the following:   BFR strength pressures.  . Protein synthesis for increased muscle hypertrophy  . Mitigate muscle atrophy during non-WB phases  . Increased growth hormone production for faster healing rate vs. low load exercise alone   . Post BFR benefits of significant decrease in pain and increased exercise tolerance  BFR endurance pressures  . Improve VO2 max, exercise time to exhaustion, and muscle mass during low load aerobic training    Administration of BFR requires the skill of a therapist to individualize 80% occlusion pressures prior to each treatment due to daily variations in resting blood  pressure.  Skilled PT will also modify exercises, and assess tissue response to pressures for safety. Contraindications and precautions are reviewed with patient prior to initiation of  BFR exercise.  Patient received education on benefit of BFR exercise and likelihood of delayed onset muscle soreness and potential bruising following use of BFR.    1 x 100 reps B heel raises with knees extended going up with 2 and down with R eccentric going down off of stairs for working into increased DF range and to improve weight acceptance during single limb plyometric movements like running.   1 x 100 reps B heel raises with knees flexed going up with 2 and down with R eccentric going down off of stairs for working into increased DF range and to improve weight acceptance during single limb plyometric movements like running.   10 minutes of walking lunges with 2, 20# KBs in hallway (patient felt woozy so terminated BFR exercise, had her sit down with back supported against wall and got her grape juice and water. She felt better within a few minutes and explained that she hasn't eaten since 11am.)        Timed minutes: 60   TOTAL minutes: 60     $ Ther Exercises (CPT 97110): 3 units  $ Manual Therapy (CPT 97140): 1 unit   ?  ?  ASSESSMENT   ?  Cynthia Goodman is making great progress in regards to her ankle AROM/PROM, LE strength and overall function measured by the LEFS and PSFS outcome measures. She does continue to require skilled therapy in order to return to sport/running as she continues to have difficulty when walking and doing ADLs out of the boot with no AD.  ?  Functional Therapy Goals                                        New or Continued Goals:      Outcomes:   Short Term (4 weeks): ?   1. 100% accuracy with home exercise program at end of each visit.   2. Patient will note ability to perform functional activity #1 from patient specific functional scale outcome measure with with at least a 40% increase in function. 2.Not met    3. Patient will note ability to perform functional activity #2 from patient specific functional scale outcome measure with with at least a 20% increase in function. 3.Met        ?  Long Term (12 weeks or by discharge): ?   1. 100% accuracy with home exercise program at end of each visit. 1.Progress made   2. Patient will note ability to perform functional activity #3 from patient specific functional scale outcome measure with with at least a 70% increase in function. 2.Met    3. Patient will feel confident in their ability to perform all functional tasks listed in the patient specific functional scale outcome measure with no fear and will have the ability to self manage their symptoms if activity does exacerbate their symptoms through skills gained in therapy.  3.Progress made      IMPRESSION AND PLAN  Cynthia Goodman 's subjective and objective findings are consistent with the aforementioned diagnosis.  Cynthia Goodman is progressing in the knowledge and ability to independently manage their symptoms and is at risk for continued impairments in: ROM, strength, joint mobility, and/or functional limitations without skilled therapy services.  Cynthia Goodman is progressing towards independence with home exercise program and symptoms management.  Treatment interventions are being used to facilitate patient progression towards their current problems and goals.  Cynthia Goodman will follow-up with physician as necessary.    Further skilled services of a licensed therapist is required for a safe progression of exercises, neuromuscular re-ed, manual therapy, and therapeutic activities as indicated to restore functional ability and mobility of her LE.    Expected Frequency/Duration of Therapy:  1x/week for, 6 weeks, or sooner if discharge criteria are met.     Treatment Interventions:   Gait Training 54098  Mobilization/Manual Therapy 97140  NM Re-Ed/Balance [id number]  Ther. Ex/Home Program [phone]  Therapeutic Activities 97530  ADL Training 78295  Aquatic Therapy 97113  Compression Therapy 97530  Core Stabilization Program 97110  Manual Lymph Drainage 97140  Postural Training/ Body  Mechanics 97110    Certified to (Recert or Discharge due): 12/01/19  Date Progress Report due: 12/11/19  _____________________________________________________________    Thank you for this referral.    Archie Balboa, PT  11/11/2019    Please sign below if you agree with the provided plan and return to the fax number listed above. Modifications/precautions are appreciated.     I certify the need for these services furnished under this plan of treatment and while under my care.    Provider Signature: _____________________________________    Date: _________________________Time:___________________    (Patient Name: Cynthia Goodman)

## 2019-11-16 ENCOUNTER — Institutional Professional Consult (permissible substitution)
Admit: 2019-11-16 | Payer: PRIVATE HEALTH INSURANCE | Attending: Student in an Organized Health Care Education/Training Program

## 2019-11-16 NOTE — Treatment Summary (Signed)
PT Treatment Note    Patient: Cynthia Goodman Certified to (Recert or Discharge due): 12/01/19  Date Progress Report due: 12/11/19   Date of birth: 05-04-1991 Referring Physician: Nanetta Batty, PA-C   Primary Referral Dx: [phone] - Closed fracture of right ankle, initial encounter  PCP:  NO FAMILY PHYSICIAN,   Visits from [facility]:   (No data recorded)    Date: 11/16/2019 Time:  1430 - 1520      Related surgeries/hospitalizations/therapy interventions include: DATE OF PROCEDURE: ?07/13/2019   ?  PROCEDURES PERFORMED:  1. ?Open reduction and internal fixation of right ankle.  2. ?Open reduction internal fixation, distal tibiofibular joint disruption,   right.  3. ?Right ankle arthroscopy with limited debridement.  ?    Precautions: Per Dr. Venia Minks PN Weightbearing?may progress to as tolerated in the boot. ?Around the house and with physical therapy she can even begin doing some weightbearing out of the boot if she is cautious. ?I do think this will help her stiffness begin to improve    SUBJECTIVE:     Patient notes that her shoulders were sore after last session, but overall feels pretty good.     Pain is mild in regards to its impact on function.     OBJECTIVE:       Treatment:  ?  ?  ? Manual Therapy: Grade IV talocrural DF mobilizations through the calcaneus was performed to decrease sensitization to the ankle to allow for increased ease of movement with transferring and walking and increased tissue tolerance during WB activities.  ?  ? Therapeutic Exercise: Exercises provided below were performed with the intent to increase tissue load tolerance with walking/weight bearing activities with demonstration and verbal/tactile cues provided throughout for exercise performance.?    3 x 15 x 100# single limb eccentric heel raises on leg press machine to work on stretching into end range DF range and to improve weight acceptance during single limb plyometric movements like running.             Russian Baby Makers x  20 reps   [place] Split Squat 3 x 8 reps   Alternating Step Ups on small plyo box 2 x 20 reps B.   ?     ASSESSMENT:   Patient's subjective/objective findings are consistent with the aforementioned diagnosis.  Patient appears motivated and is making progress towards goals listed in PT Evaluation.  At conclusion of today's therapy session, patient reported fatigue with new exercises, however she was able to bring her butt to her heels into a deep squat, which she was unable to do this morning during yoga. She was encouraged to begin jumping rope for 10 seconds at a time to introduce beginning plyometrics for return to running progression. They still need skilled therapy in order to work on improving load tolerance during WB tasks such as running and uni wheeling to achieve their therapy goals.     PLAN:   Patient will be progressed towards independence with Home Exercise Program and symptom management.  Plan to progress ankle DF and WB as tolerated using BFR.  Lower pool to stomach/belly button level and/or add jets next aquatic therapy session.   Tall kneeling with BFR to increase tolerance to PF.      Archie Balboa, PT   11/16/2019        Timed minutes: 50   TOTAL minutes: 50     $ Ther Exercises (CPT 97110): 2 units  $ Manual Therapy (CPT 97140): 1 unit  Therapist/License #: Archie Balboa, PT [phone]

## 2019-11-23 ENCOUNTER — Institutional Professional Consult (permissible substitution)
Admit: 2019-11-23 | Payer: PRIVATE HEALTH INSURANCE | Attending: Student in an Organized Health Care Education/Training Program

## 2019-11-23 DIAGNOSIS — S82891A Other fracture of right lower leg, initial encounter for closed fracture: Secondary | ICD-10-CM

## 2019-11-23 NOTE — Treatment Summary (Signed)
PT Treatment Note    Patient: Cynthia Goodman Certified to (Recert or Discharge due): 12/01/19  Date Progress Report due: 12/11/19   Date of birth: Nov 05, 1991 Referring Physician: Nanetta Batty, PA-C   Primary Referral Dx: [phone] - Closed fracture of right ankle, initial encounter  PCP:  NO FAMILY PHYSICIAN,   Visits from [facility]:   (No data recorded)    Date: 11/23/2019 Time:  1533 - 1614      Related surgeries/hospitalizations/therapy interventions include: DATE OF PROCEDURE: ?07/13/2019   ?  PROCEDURES PERFORMED:  1. ?Open reduction and internal fixation of right ankle.  2. ?Open reduction internal fixation, distal tibiofibular joint disruption,   right.  3. ?Right ankle arthroscopy with limited debridement.  ?    Precautions: Per Dr. Venia Minks PN Weightbearing?may progress to as tolerated in the boot. ?Around the house and with physical therapy she can even begin doing some weightbearing out of the boot if she is cautious. ?I do think this will help her stiffness begin to improve    SUBJECTIVE:     Patient notes she was sore for 4 days after last session. Today she did a little yoga. She is wondering why she continues to have some medial ankle pain.    Pain is mild in regards to its impact on function.     OBJECTIVE:       Treatment:  ?  ? Manual Therapy: Grade IV talocrural DF mobilizations to TC jt and calcaneus was performed to decrease sensitization to the ankle to allow for increased ease of movement with transferring and walking and increased tissue tolerance during WB activities.  ?  ? Therapeutic Exercise: Exercises provided below were performed with the intent to increase tissue load tolerance with walking/weight bearing activities with demonstration and verbal/tactile cues provided throughout for exercise performance.?    Squats with green band around knees with cues to grab the floor with her feet and try to break the band by brining her knees out 5 x 5  Single limb short footing steam engines F/B  and Lateral  10 x 10 each  MWM going into increased DF and lateral tibial glide during squats   Education on use of orthotic for improving mid foot stability - recommended vasyli or superfeet  Went over wooden dowel ankle mobilization sequence for improving ankle DF without mid foot contribution/compensation    ?     ASSESSMENT:   Patient's subjective/objective findings are consistent with the aforementioned diagnosis.  Patient appears motivated and is making progress towards goals listed in PT Evaluation.  At conclusion of today's therapy session, patient reported fatigue with new exercises, however she was able to bring her butt to her heels into a deep squat without having any caving of her mid foot or dynamic knee valgus. She likely had some medial heel pain due to her inability to bring her knee laterally over her stable ankle/lateral tibial glide during walking and squatting movements. They still need skilled therapy in order to work on improving load tolerance during WB tasks such as running and uni wheeling to achieve their therapy goals.     PLAN:   Patient will be progressed towards independence with Home Exercise Program and symptom management.  Plan to progress ankle DF and WB as tolerated using BFR.  Lower pool to stomach/belly button level and/or add jets next aquatic therapy session.   Tall kneeling with BFR to increase tolerance to PF.      Archie Balboa, PT   11/23/2019  Timed minutes: 41   TOTAL minutes: 41     $ Ther Exercises (CPT 97110): 2 units  $ Manual Therapy (CPT 97140): 1 unit     Therapist/License #: Archie Balboa, PT [phone]

## 2019-11-30 ENCOUNTER — Encounter: Payer: PRIVATE HEALTH INSURANCE | Attending: Student in an Organized Health Care Education/Training Program

## 2019-12-07 ENCOUNTER — Institutional Professional Consult (permissible substitution)
Admit: 2019-12-07 | Payer: PRIVATE HEALTH INSURANCE | Attending: Student in an Organized Health Care Education/Training Program

## 2019-12-07 NOTE — Treatment Summary (Signed)
PT Treatment Note    Patient: Cynthia Goodman Certified to (Recert or Discharge due): 12/01/19  Date Progress Report due: 12/11/19   Date of birth: 10-Dec-1991 Referring Physician: Nanetta Batty, PA-C   Primary Referral Dx: [phone] - Closed fracture of right ankle, initial encounter  PCP:  NO FAMILY PHYSICIAN,   Visits from [facility]: 15 (No data recorded)    Date: 12/07/2019 Time:  1430 - 1515      Related surgeries/hospitalizations/therapy interventions include: DATE OF PROCEDURE: ?07/13/2019   ?  PROCEDURES PERFORMED:  1. ?Open reduction and internal fixation of right ankle.  2. ?Open reduction internal fixation, distal tibiofibular joint disruption,   right.  3. ?Right ankle arthroscopy with limited debridement.      SUBJECTIVE:     Patient notes she did a lot of squats and power yoga today and is sore as a result. She has not returned to running yet and explains that her ankle feels tight and painful (points to joint line).    Pain is mild in regards to its impact on function.     OBJECTIVE:     Plyometric evidence based progression requirements:    o Squat 60% of Body Mass (on the barbell) 5 x in 5 seconds  o Free Weight squat 1.5 - 2.5 x body mass  o Single leg half squat with no pain and good movement pattern  o No pain or discomfort with beginning plyometric activities  o Balance with eyes closed and open at least 30 seconds   Good ROM in LE Joints         Treatment:  ?  ? Manual Therapy: Grade IV talocrural DF mobilizations to TC jt, R hip and STM to bottom of her foot was performed to decrease sensitization to the ankle to allow for increased ease of movement with transferring and walking and increased tissue tolerance during WB activities.  ?  ? Therapeutic Exercise: Exercises provided below were performed with the intent to increase tissue load tolerance with walking/weight bearing activities with demonstration and verbal/tactile cues provided throughout for exercise performance.?    Single limb 1/4 squats  with cues for performance throughout for increased quad vs. glute bias 3 x 10 B     Squat analysis and progression with cues for performance throughout including bar position, dynamic knee valgus, belly breathing/bracing, foot position, fixed good morning squat, cued gaze and performed 5 x 5 with 45# olympic bar.  ?     ASSESSMENT:   Patient's subjective/objective findings are consistent with the aforementioned diagnosis.  Patient appears motivated and is making progress towards goals listed in PT Evaluation.  At conclusion of today's therapy session, patient reported fatigue with new exercises, however she was able to perform a the squat with quad vs. glute bias and notice the difference. If she practices at home and has no pain with her squat then we may try increasing weight. Additionally she noted the mobilization to her ankle did not feel good, but was walking with less pain afterwards. They still need skilled therapy in order to work on improving load tolerance during WB tasks such as running and uni wheeling to achieve their therapy goals.     PLAN:   Patient will be progressed towards independence with Home Exercise Program and symptom management.  Continue to work on return to plyometrics and running progression.      Archie Balboa, PT   12/07/2019        Timed minutes: 45  TOTAL minutes: 45     $ Ther Exercises (CPT 97110): 2 units  $ Manual Therapy (CPT 97140): 1 unit     Therapist/License #: Archie Balboa, PT [phone]

## 2019-12-13 ENCOUNTER — Institutional Professional Consult (permissible substitution)
Admit: 2019-12-13 | Payer: PRIVATE HEALTH INSURANCE | Attending: Student in an Organized Health Care Education/Training Program

## 2019-12-13 NOTE — Treatment Summary (Signed)
PT Treatment Note    Patient: Cynthia Goodman Certified to (Recert or Discharge due): 12/01/19  Date Progress Report due: 12/11/19   Date of birth: [birth year] Referring Physician: Nanetta Batty, PA-C   Primary Referral Dx: [phone] - Closed fracture of right ankle, initial encounter  PCP:  NO FAMILY PHYSICIAN,   Visits from [facility]: 16 (No data recorded)    Date: 12/13/2019 Time:  1430 - 1513      Related surgeries/hospitalizations/therapy interventions include: DATE OF PROCEDURE: ?07/13/2019   ?  PROCEDURES PERFORMED:  1. ?Open reduction and internal fixation of right ankle.  2. ?Open reduction internal fixation, distal tibiofibular joint disruption,   right.  3. ?Right ankle arthroscopy with limited debridement.      SUBJECTIVE:     Patient notes she is struggling with single limb mini squats and has been practicing them diligently. She explains having a throbbing pain at rest for the past week and notices this pain with all movements but especially end ranges of DF and PF.    Pain is mild in regards to its impact on function.     OBJECTIVE:     Plyometric evidence based progression requirements:    o Squat 60% of Body Mass (on the barbell) 5 x in 5 seconds  o Free Weight squat 1.5 - 2.5 x body mass  o Single leg half squat with no pain and good movement pattern  o No pain or discomfort with beginning plyometric activities  o Balance with eyes closed and open at least 30 seconds   Good ROM in LE Joints         Treatment:  ?  ? Manual Therapy: DSTM to entirety of distal knee LE musculature as well as Grade IV talocrural DF mobilizations to TC jt, and STM to bottom of her foot was performed to decrease sensitization to the ankle to allow for increased ease of movement with transferring and walking and increased tissue tolerance during WB activities.  ?  ? Therapeutic Exercise: Exercises provided below were performed with the intent to increase tissue load tolerance with walking/weight bearing activities with  demonstration and verbal/tactile cues provided throughout for exercise performance.?    None today although encouraged her to now try parallel single limb squats at home. ?       ASSESSMENT:   Patient's subjective/objective findings are consistent with the aforementioned diagnosis.  Patient appears motivated and is making progress towards goals listed in PT Evaluation.  At conclusion of today's therapy session, patient reported no pain after treatment today with all ankle AROM and most likely had increased soreness from doing her exercises consistently and not having a recovery day. She explained that her ankle felt opened up after treatment today allowing her to perform a single limb squat (mini) pain free. They still need skilled therapy in order to work on improving load tolerance during WB tasks such as running and uni wheeling to achieve their therapy goals.     PLAN:   Patient will be progressed towards independence with Home Exercise Program and symptom management.  Continue to work on return to plyometrics and running progression.      Archie Balboa, PT   12/13/2019        Timed minutes: 43   TOTAL minutes: 43     $ Manual Therapy (CPT 97140): 3 units     Therapist/License #: Archie Balboa, PT [phone]

## 2019-12-20 ENCOUNTER — Institutional Professional Consult (permissible substitution)
Admit: 2019-12-20 | Payer: PRIVATE HEALTH INSURANCE | Attending: Student in an Organized Health Care Education/Training Program

## 2019-12-20 DIAGNOSIS — S82891A Other fracture of right lower leg, initial encounter for closed fracture: Secondary | ICD-10-CM

## 2019-12-20 NOTE — Other (Signed)
Corinne THREE [facility]  [facility]  [address]  [place], [place] [address]  Dept:  [phone]  Fax:  [phone]  Loc:  [phone]    Physical Therapy Recertification    Patient Name: Cynthia Goodman FIEPP'I Date: 12/20/2019   Date of Birth: 03-26-1991 Referring Physician: Nanetta Batty, PA-C   Visits from [facility]:  17  PCP: NO FAMILY PHYSICIAN,   Current Auth visit #: [id number] MRN: [id number]   Dates of Service: No data recorded -  12/20/2019  Gender:  female   Primary Referral Dx: [phone] - Closed fracture of right ankle, initial encounter    Treatment Dx: No data recorded  Payor: [employer] / Plan: [employer] / Product Type: *No Product type* /      No data recorded     Start Time: 1430     Stop Time: 1515 Certified to (Recert or Discharge due): 01/10/20  Date Progress Report due: 01/10/20       Related surgeries/hospitalizations/therapy interventions include:?DATE OF PROCEDURE: ?07/13/2019   ?  PROCEDURES PERFORMED:  1. ?Open reduction and internal fixation of right ankle.  2. ?Open reduction internal fixation, distal tibiofibular joint disruption,   right.  3. ?Right ankle arthroscopy with limited debridement.    SUBJECTIVE:     When asked if Haruna was confident in her ability to be discharged she explains that she is not. She would like to continue working on returning to running and the strength training that is involved with getting back to running.     Pain is mild in regards to its impact on function.     Related surgeries/hospitalizations/therapy interventions include: DATE OF PROCEDURE: ?07/13/2019   ?  PROCEDURES PERFORMED:  1. ?Open reduction and internal fixation of right ankle.  2. ?Open reduction internal fixation, distal tibiofibular joint disruption,   right.  3. ?Right ankle arthroscopy with limited debridement.  ?  ?  Corlette Beem's reports the following functional activity limitations associated with the reason for  this referral -  Revised Patient Specific Functional Scale:  DESCRIPTION OF ACTIVITIES:  10=no difficulty   0=unable to perform PLOF  07/05/2019 09/02/2019  ?    09/30/2019    With a boot 11/11/2019    Without a boot 12/20/2019   Walking and driving without aid 10 0 0 5 9 (first thing getting up from seated position and walking bothers her)   Stairs 10 4 7 7 10    Showering and bathing 10 2 10 10 10    Doing laundry 10 6 10 9 10    Standing for 1 hour doing household activities 10  4  ? 10 9 10    Total sum of scores 50/50 16/50 37/50  40/50 49/50   Average score 10 3.2 7.4 8 9.8   Average % function 100% 32% 74% 80% 98%   Minimum detectable change (90% CI) for average score = 2 points  Minimum detectable change (90% CI) for single activity score = 3 points  ?  ?  Pain Level  Current status: Mild (pain does not limit pt's ability to participate in therapy)      ?  OBJECTIVE FINDINGS   ?  LEFS: 34/80 = 43% Functional and 57% Impaired 09/02/2019  ?  LEFS: 50/80 = 63% Functional and 37% Impaired 09/30/2019 without boot     LEFS: 66/80 = 83% Functional and 17% Impaired 11/11/2019 without boot     LEFS: 74/80 = 93% Functional and 7% Impaired  12/20/2019 without boot     ?  AD: Ankle boot  ?  Knee AROM = PROM:  ?  Knee Flexion:  R: WNL    L: WNL  Knee Ext:        R: WNL    L: WNL  ?  Ankle AROM:  ?  Ankle DF:        R: 6 cm from the wall in WB (was 16 cm from the wall tested in weight bearing))   L: Knee to wall (knee to wall with 1 foot 1 hand width away from wall)  Ankle PF:        R: WNL (was 25 degrees)  L: WNL  Ankle Inv:        R: WNL (was 15 degrees)   L: WNL  Ankle Ev:        R: WNL (was 15 degrees)   L: WNL  ?  Lateral tibial glide was WNL degrees on R   ?  1st MTP Extension: R: 70 (10 degrees)   L: WNL (need 70 degrees for normal gait)  ?  LE Strength:  ?  Hip Flexion:     R: 4-/5   L: 4/5  Hip Ext:           R: 4-/5   L: 4/5  Hip Abd:         R: 3+/5   L: 4/5  ?  Knee Flexion: R: 4-/5   L: 4/5  Knee Ext:         R: 4-/5   L: 4/5  ?  Ankle DF:        R: 4-/5   L: 4/5  Ankle Inv:        R:4-/5   L: 4/5  Ankle Ev:        R: 4-/5   L: 4/5  Ankle PF Endurance:        R: 20 reps (1 Heel raises last session)  L: 30 reps   ?    Treatment:  ?  ?  ? Manual Therapy: None today   ?  ? Therapeutic Exercise: Exercises provided below were performed with the intent to increase tissue load tolerance with walking/weight bearing activities with demonstration and verbal/tactile cues provided throughout for exercise performance.   ?  LE isometrics, Ankle AROM/PROM, heel raises B and outcome measure assessments    Side lying hip abduction against wall 3 x 8    Running analysis with heavy landing on L and decreased time on R foot.    Heel raise in lunge position to simulate landing position on R ankle foot pain with eccentric loading 3 x 10    [employer] taping to bottom of foot for improved running tolerance    Jogging with 8 x 50' length         Timed minutes: 45   TOTAL minutes: 45     $ Ther Exercises (CPT 97110): 3 units   ?  ?  ASSESSMENT   ?  Jaylah is making great progress in regards to her ankle AROM/PROM, LE strength and overall function measured by the LEFS and PSFS outcome measures. She does continue to require skilled therapy in order to return to sport/running as she continues to have difficulty when trying to get run. She would benefit from a few more sessions to address return to running before she ind. Works on getting back to running.   ?  Functional Therapy Goals  New or Continued Goals:      Outcomes:   Short Term (4 weeks): ?   1. 100% accuracy with home exercise program at end of each visit.   2. Patient will note ability to perform functional activity #1 from patient specific functional scale outcome measure with with at least a 40% increase in function. 2.Met     3. Patient will note ability to perform functional activity #2 from patient specific functional scale outcome measure  with with at least a 20% increase in function. 3.Met     ?  Long Term (12 weeks or by discharge): ?   1. 100% accuracy with home exercise program at end of each visit. 1.Met     2. Patient will note ability to perform functional activity #3 from patient specific functional scale outcome measure with with at least a 70% increase in function. 2.Met    3. Patient will feel confident in their ability to perform all functional tasks listed in the patient specific functional scale outcome measure with no fear and will have the ability to self manage their symptoms if activity does exacerbate their symptoms through skills gained in therapy.  3.Progress made      IMPRESSION AND PLAN  Amatullah Christy 's subjective and objective findings are consistent with the aforementioned diagnosis.  Jhania Etherington is progressing in the knowledge and ability to independently manage their symptoms and is at risk for continued impairments in: ROM, strength, joint mobility, and/or functional limitations without skilled therapy services.    Fumiye Lubben is progressing towards independence with home exercise program and symptoms management.  Treatment interventions are being used to facilitate patient progression towards their current problems and goals.  Iver Nestle will follow-up with physician as necessary.    Further skilled services of a licensed therapist is required for a safe progression of exercises, neuromuscular re-ed, manual therapy, and therapeutic activities as indicated to restore functional ability and mobility of her LE.    Expected Frequency/Duration of Therapy:  1x/week for, 3 weeks, or sooner if discharge criteria are met.     Treatment Interventions:   Gait Training 72536  Mobilization/Manual Therapy 97140  NM Re-Ed/Balance [id number]  Ther. Ex/Home Program [phone]  Therapeutic Activities 97530  ADL Training 47425  Aquatic Therapy 97113  Compression Therapy 97530  Core Stabilization Program 97110  Manual  Lymph Drainage 97140  Postural Training/ Body Mechanics 97110    Certified to (Recert or Discharge due): 01/10/20  Date Progress Report due: 01/10/20  _____________________________________________________________    Thank you for this referral.    Archie Balboa, PT  12/20/2019    Please sign below if you agree with the provided plan and return to the fax number listed above. Modifications/precautions are appreciated.     I certify the need for these services furnished under this plan of treatment and while under my care.    Provider Signature: _____________________________________    Date: _________________________Time:___________________    (Patient Name: Iver Nestle)

## 2019-12-22 NOTE — Telephone Encounter (Signed)
Lm for pt to call back to see how she is progressing.

## 2019-12-22 NOTE — Telephone Encounter (Signed)
Cynthia Goodman is returning your call regarding progress.    Patient call back number is: [phone]    Best time for call back.anytime    Can a detailed message be left? Yes    Additional Comments:

## 2019-12-22 NOTE — Telephone Encounter (Signed)
-----   Message from Nanetta Batty, [place] sent at 12/13/2019  7:48 PM PST -----  Regarding: FW: Non-Urgent Medical Question  Contact: [phone]  I just saw this message.  Can you reach out to her and see how she is doing?    Ryan  ----- Message -----  From: Awanda Mink, CMA  Sent: 11/29/2019   1:25 PM PST  To: Nanetta Batty, PA-C  Subject: FW: Non-Urgent Medical Question                  Patient had OFIR surgery on 07/13/2019. Would you consider nerve pain to still be normal at this point? She does not have a scheduled appt at this time. Please advise.   ----- Message -----  From: Iver Nestle  Sent: 11/29/2019   1:18 PM PST  To: [facility]  Subject: Non-Urgent Medical Question                      For about a month now I have increasingly been having nerve pains under my inner-ankle scar (small scar). It's an uncomfortable zing of tingly, almost-painful feeling that triggers in movement and to the touch. During my most recent PT session, the thought is that it's how I am applying weight to my foot when I walk. So I want to try to work on that. What worries me is that I feel it when that area is rubbed. I'm concerned because it the pain seems to happen frequently in general.

## 2019-12-23 NOTE — Telephone Encounter (Signed)
Returned call to patient. Her nerve pain has shown improvement. She states she still has minor nerve pain at this time. I advised pt that as long as it continues to improve this can be very normal. She agrees to call back should symptoms worsen.

## 2019-12-28 ENCOUNTER — Institutional Professional Consult (permissible substitution)
Admit: 2019-12-28 | Payer: PRIVATE HEALTH INSURANCE | Attending: Student in an Organized Health Care Education/Training Program

## 2019-12-28 DIAGNOSIS — S82891A Other fracture of right lower leg, initial encounter for closed fracture: Secondary | ICD-10-CM

## 2019-12-28 NOTE — Treatment Summary (Signed)
PT Treatment Note    Patient: Cynthia Goodman Certified to (Recert or Discharge due): 01/10/20  Date Progress Report due: 01/10/20   Date of birth: 20-Dec-1991 Referring Physician: Nanetta Batty, PA-C   Primary Referral Dx: [phone] - Closed fracture of right ankle, initial encounter  PCP:  NO FAMILY PHYSICIAN,   Visits from [facility]: 18 (No data recorded)    Date: 12/28/2019 Time:  1430 - 1515      Related surgeries/hospitalizations/therapy interventions include: DATE OF PROCEDURE: ?07/13/2019   ?  PROCEDURES PERFORMED:  1. ?Open reduction and internal fixation of right ankle.  2. ?Open reduction internal fixation, distal tibiofibular joint disruption,   right.  3. ?Right ankle arthroscopy with limited debridement.      SUBJECTIVE:     Patient notes she was not able to do yoga this morning and is excited to try and start jogging in therapy as she is apprehensive to try ind.     Pain is mild in regards to its impact on function.     OBJECTIVE:       Treatment:  ?  ? Manual Therapy: None today.  ?  ? Therapeutic Exercise: Exercises provided below were performed with the intent to increase tissue load tolerance with walking/weight bearing activities with demonstration and verbal/tactile cues provided throughout for exercise performance.?    Elliptical x 10 minutes at level 1   Hip airplanes 3 x 10 B  Jogging and walking 1 minute: 1 minute for a total of 14 minutes for return to run (started to have some symptoms in ankle at 14 minutes so discontinued running at this time.        ASSESSMENT:   Patient's subjective/objective findings are consistent with the aforementioned diagnosis.  Patient appears motivated and is making progress towards goals listed in PT Evaluation.  At conclusion of today's therapy session, patient reported no pain after treatment today with starting with return to run program. She was coached to begin every other minute jogging until symptom exacerbation every other day until she comes in for her next  appt. If she is able to slowly increase running volume by the next session then the pace will be increase to faster run then sprints. She did have some posterior ankle irritation around minute 14 today and she was advised to stop when starting to feel symptoms in an effort to not progress to quickly with her return to run program. They still need skilled therapy in order to work on improving load tolerance during WB tasks such as running and uni wheeling to achieve their therapy goals.     PLAN:   Patient will be progressed towards independence with Home Exercise Program and symptom management.  Continue to work on return to plyometrics and running progression.      Archie Balboa, PT   12/28/2019        Timed minutes: 45   TOTAL minutes: 45     $ Ther Exercises (CPT 97110): 3 units     Therapist/License #: Archie Balboa, PT [phone]

## 2020-01-04 ENCOUNTER — Institutional Professional Consult (permissible substitution)
Admit: 2020-01-05 | Payer: PRIVATE HEALTH INSURANCE | Attending: Student in an Organized Health Care Education/Training Program

## 2020-01-04 NOTE — Treatment Summary (Signed)
PT Treatment Note    Patient: Cynthia Goodman Certified to (Recert or Discharge due): 01/10/20  Date Progress Report due: 01/10/20   Date of birth: 1991/10/10 Referring Physician: Nanetta Batty, PA-C   Primary Referral Dx: [phone] - Closed fracture of right ankle, initial encounter  PCP:  NO FAMILY PHYSICIAN,   Visits from [facility]: 19 (No data recorded)    Date: 01/04/2020 Time:  1645 - 1730      Related surgeries/hospitalizations/therapy interventions include: DATE OF PROCEDURE: ?07/13/2019   ?  PROCEDURES PERFORMED:  1. ?Open reduction and internal fixation of right ankle.  2. ?Open reduction internal fixation, distal tibiofibular joint disruption,   right.  3. ?Right ankle arthroscopy with limited debridement.      SUBJECTIVE:     Patient notes she was able to do 20 minutes of running before symptom increased.     Pain is mild in regards to its impact on function.     OBJECTIVE:       Treatment:  ?  ? Manual Therapy: None today.  ?  ? Therapeutic Exercise: Exercises provided below were performed with the intent to increase tissue load tolerance with walking/weight bearing activities with demonstration and verbal/tactile cues provided throughout for exercise performance.?    Elliptical x 10 minutes at level 8-9  Obstacle course in the hallway using mini hurdles and cones working on lateral agility and high stepping x 2000' of total running  10 flights of stairs with the first 5 flights sprinting every other step up and the last 5 every step going up.        ASSESSMENT:   Patient's subjective/objective findings are consistent with the aforementioned diagnosis.  Patient appears motivated and is making progress towards goals listed in PT Evaluation.  At conclusion of today's therapy session, patient reported no pain after treatment today and was able to negotiate obstacles in hall way. She was encouraged to try and easy trail run between now and next session and if she is symptom free and confident next session she  will be appropriate to discharge.     PLAN:   Patient will be progressed towards independence with Home Exercise Program and symptom management.  Continue to work on return to plyometrics and running progression unless she goes on a trail run and is confident in her ability to return to running then DC.     Archie Balboa, PT   01/04/2020        Timed minutes: 45   TOTAL minutes: 45     $ Ther Exercises (CPT 97110): 3 units     Therapist/License #: Archie Balboa, PT [phone]

## 2020-01-11 ENCOUNTER — Institutional Professional Consult (permissible substitution)
Admit: 2020-01-12 | Payer: PRIVATE HEALTH INSURANCE | Attending: Student in an Organized Health Care Education/Training Program

## 2020-01-11 NOTE — Discharge Summary (Signed)
[facility]  [place] [facility]  [address]  [place], [place] [address]  Dept:  [phone]  Fax:  [phone]  Loc:  [phone]    Physical Therapy Discharge Summary     Patient Name: Cynthia Goodman [id number] Date: 01/11/2020   Date of Birth: [birth year] Referring Physician: Nanetta Batty, PA-C   Visits from [facility]:  20  PCP: NO FAMILY PHYSICIAN,   Current Auth visit #: [id number] MRN: [id number]   Dates of Service: No data recorded -  01/11/2020  Gender:  female   Primary Referral Dx: S82.891A - Closed fracture of right ankle, initial encounter    Treatment Dx: No data recorded  Payor: [employer] / Plan: [employer] / Product Type: *No Product type* /      No data recorded     Start Time: 1630     Stop Time: 1715 Certified to (Recert or Discharge due): 01/10/20  Date Progress Report due: 01/10/20     Related surgeries/hospitalizations/therapy interventions include:?DATE OF PROCEDURE: ?07/13/2019   ?  PROCEDURES PERFORMED:  1. ?Open reduction and internal fixation of right ankle.  2. ?Open reduction internal fixation, distal tibiofibular joint disruption,   right.  3. ?Right ankle arthroscopy with limited debridement.  ?  SUBJECTIVE:   ?  Cynthia Goodman notes she was able to go on a run which did not irritate her ankle at all, but would like to go over and video each exercise prescribed in PT for her HEP.   ?  Related surgeries/hospitalizations/therapy interventions include:?DATE OF PROCEDURE: ?07/13/2019   ?  PROCEDURES PERFORMED:  1. ?Open reduction and internal fixation of right ankle.  2. ?Open reduction internal fixation, distal tibiofibular joint disruption,   right.  3. ?Right ankle arthroscopy with limited debridement.  ?  ?  Cynthia Goodman reports the following functional activity limitations associated with the reason for this referral -  Revised Patient Specific Functional Scale:  DESCRIPTION OF ACTIVITIES:  10=no difficulty  ?0=unable to  perform PLOF  07/05/2019 09/02/2019  ?  ? 09/30/2019  ?  With a boot 11/11/2019  ?  Without a boot 12/20/2019 01/11/2020   Walking and driving without aid 10 0 0 5 9 (first thing getting up from seated position and walking bothers her) 10   Stairs 10 4 7 7 10 10    Showering and bathing 10 2 10 10 10 10    Doing laundry 10 6 10 9 10 10    Standing for 1 hour doing household activities 10 ?4  ? 10 9 10 10    Total sum of scores 50/50 16/50 37/50  40/50 49/50 50/50    Average score 10 3.2 7.4 8 9.8 10   Average % function 100% 32% 74% 80% 98% 10%   Minimum detectable change (90% CI) for average score = 2 points  Minimum detectable change (90% CI) for single activity score = 3 points  ?  ?  Pain does not impact function  ?  OBJECTIVE FINDINGS?  ?  LEFS: 34/80 = 43% Functional and 57% Impaired?09/02/2019  ?  LEFS: 50/80 = 63% Functional and 37% Impaired?09/30/2019 without boot   ?  LEFS: 66/80 = 83% Functional and 17% Impaired?11/11/2019 without boot   ?  LEFS: 74/80 = 93% Functional and 7% Impaired?12/20/2019 without boot     LEFS: 75/80 = 94% Functional and 6% Impaired?01/11/2020 without boot     ?  Knee?AROM =?PROM:  ?  Knee Flexion: ?R: WNL????L: WNL  Knee  Ext: ???????R: WNL????L: WNL  ?  Ankle AROM:  ?  Ankle DF: ???????R: WNL after treatment today???L: Knee to wall (knee to wall with 1 foot 1 hand width away from wall)  Ankle PF: ???????R: WNL (was 25?degrees) ?L: WNL  Ankle Inv: ???????R: WNL (was 15?degrees) ??L: WNL  Ankle Ev: ???????R: WNL (was 15?degrees) ??L: WNL  ?  Lateral tibial glide was WNL degrees on R   ?  1st MTP Extension: R:?70 (10?degrees) ??L: WNL?(need 70 degrees for normal gait)  ?  LE Strength:  ?  Hip Flexion: ????R: 4/5 ??L: 4/5  Hip Ext: ??????????R: 4/5 ??L: 4/5  Hip Abd: ????????R: 4/5 ??L: 4/5  ?  Knee Flexion: R: 4/5 ??L: 4/5  Knee Ext: ???????R: 4/5 ??L: 4/5  ?  Ankle DF: ???????R: 4/5 ??L: 4/5  Ankle Inv: ???????R:4/5 ??L: 4/5  Ankle Ev: ???????R: 4/5 ??L: 4/5  Ankle PF Endurance:  ???????R: 23 reps (1?Heel raises last session)??L: 30 reps   ?  ?  Treatment:  ?  ? Therapeutic Exercise: Exercises provided below were performed with the intent to increase tissue load tolerance with walking/weight bearing activities with demonstration and verbal/tactile cues provided throughout for exercise performance.?  ?  LE isometrics, Ankle AROM/PROM, heel raises B and outcome measure assessments    Took video of good form of all prescribed exercises for HEP walking through form and movement faults.  ?  Architectural technologist determined ther ex combined with blood flow restriction (BFR) is indicated in [name] to facilitate the following:   BFR strength pressures.  . Protein synthesis for increased muscle hypertrophy  . Mitigate muscle atrophy during non-WB phases  . Increased growth hormone production for faster healing rate vs. low load exercise alone   . Post BFR benefits of significant decrease in pain and increased exercise tolerance  BFR endurance pressures  . Improve VO2 max, exercise time to exhaustion, and muscle mass during low load aerobic training    Administration of BFR requires the skill of a therapist to individualize 80% occlusion pressures prior to each treatment due to daily variations in resting blood pressure.  Skilled PT will also modify exercises, and assess tissue response to pressures for safety. Contraindications and precautions are reviewed with patient prior to initiation of BFR exercise.  Patient received education on benefit of BFR exercise and likelihood of delayed onset muscle soreness and potential bruising following use of BFR.    4 minutes of 1/4 squat stance lvl 1 on elliptical followed by 11 minutes walking in hallway  ?  ?  ASSESSMENT   ?  Cynthia Goodman is back to running and has met all of her PT goals. She is 100% functional per PSFS and after videoing exercises for HEP she is confident in her ability to be DC from PT.  ?  Functional Therapy  Goals  ????????????????????????????????????  New or Continued Goals: ???? Outcomes:   Short Term?(4 weeks): ?   1. 100% accuracy with home exercise program at end of each visit.   2. Patient will note ability to perform functional activity #1 from patient specific functional scale outcome measure with with at least a?40% increase in function. 2.Met??   3. Patient will note ability to perform functional activity #2 from patient specific functional scale outcome measure with with at least a?20% increase in function. 3.Met??   ?  Long Term?(12?weeks or by discharge): ?   1. 100% accuracy with home exercise program at end  of each visit. 1.Met??   2. Patient will note ability to perform functional activity #3 from patient specific functional scale outcome measure with with at least a?70% increase in function. 2.Met?   3. Patient will feel confident in their ability to perform all functional tasks listed in the patient specific functional scale outcome measure with no fear and will have the ability to self manage their symptoms if activity does exacerbate their symptoms through skills gained in therapy.  3.Met?     Discharge Reason: Cynthia Goodman has attained her goals.     Discharge Summary: Cynthia Goodman 's subjective and objective findings were consistent with the aforementioned diagnosis.  Cynthia Goodman  lacked the knowledge or ability to independently manage initial symptoms and was at risk for continued impairments in: ROM, strength, joint mobility, and/or functional limitations without skilled therapy services. Treatment interventions were used to facilitate patient progression towards the goals listed above. Skilled services of a licensed therapist are no longer required.    Recommendations: Cynthia Goodman was progressed towards independence with home exercise program and symptom management.   Cynthia Goodman will follow-up with physician as necessary.    Thank you for this  referral.    Archie Balboa, PT   01/11/2020        Timed minutes: 45   TOTAL minutes: 45     $ Ther Exercises (CPT 97110): 3 units
